# Patient Record
Sex: Female | Born: 1965 | Race: White | Hispanic: No | Marital: Married | Smoking: Never smoker
Health system: Southern US, Community
[De-identification: ages and names within clinical notes are randomized; demographics above are authoritative.]

## PROBLEM LIST (undated history)

## (undated) DIAGNOSIS — M542 Cervicalgia: Secondary | ICD-10-CM

## (undated) DIAGNOSIS — M25512 Pain in left shoulder: Secondary | ICD-10-CM

## (undated) DIAGNOSIS — K649 Unspecified hemorrhoids: Secondary | ICD-10-CM

## (undated) DIAGNOSIS — E119 Type 2 diabetes mellitus without complications: Secondary | ICD-10-CM

## (undated) DIAGNOSIS — F4321 Adjustment disorder with depressed mood: Secondary | ICD-10-CM

## (undated) HISTORY — PX: BREAST BIOPSY: SHX20

## (undated) HISTORY — PX: BREAST SURGERY: SHX581

## (undated) HISTORY — PX: REDUCTION MAMMAPLASTY: SUR839

## (undated) HISTORY — DX: Unspecified hemorrhoids: K64.9

## (undated) HISTORY — PX: ABDOMINOPLASTY: SUR9

---

## 2009-10-02 ENCOUNTER — Emergency Department (HOSPITAL_BASED_OUTPATIENT_CLINIC_OR_DEPARTMENT_OTHER): Admission: EM | Admit: 2009-10-02 | Discharge: 2009-10-02 | Payer: Self-pay | Admitting: Emergency Medicine

## 2009-10-02 ENCOUNTER — Ambulatory Visit: Payer: Self-pay | Admitting: Diagnostic Radiology

## 2010-12-17 ENCOUNTER — Ambulatory Visit
Admission: RE | Admit: 2010-12-17 | Discharge: 2010-12-17 | Payer: Self-pay | Source: Home / Self Care | Attending: Family Medicine | Admitting: Family Medicine

## 2010-12-17 DIAGNOSIS — K219 Gastro-esophageal reflux disease without esophagitis: Secondary | ICD-10-CM | POA: Insufficient documentation

## 2010-12-17 DIAGNOSIS — M62838 Other muscle spasm: Secondary | ICD-10-CM | POA: Insufficient documentation

## 2010-12-24 NOTE — Assessment & Plan Note (Signed)
Summary: new pt to estab, neck pain///sph   Vital Signs:  Patient profile:   45 year old female Height:      62.25 inches Weight:      151 pounds BMI:     27.50 Pulse rate:   66 / minute BP sitting:   104 / 70  (left arm)  Vitals Entered By: Doristine Devoid CMA (December 17, 2010 8:39 AM) CC: NEW EST- neck pain causes HA also radiates to L shoulder    History of Present Illness: 45 yo woman here today to establish care.  previous MD- Riley Nearing.  neck pain- sxs started 2 yrs ago.  initial episode put pt in bed for 1 month.  starts on L side of neck and radiates down into shoulder.  causes HAs.  has difficulty sleeping due to pain.  will have intermittant numbness of upper L arm.  has associated L arm weakness.  has tried ASA, Advil, Aleve w/ temporary results.  has also used muscle cream.  pain is worse w/ activity.    Preventive Screening-Counseling & Management  Alcohol-Tobacco     Alcohol drinks/day: <1     Smoking Status: never  Caffeine-Diet-Exercise     Does Patient Exercise: yes     Type of exercise: yoga, walking      Sexual History:  currently monogamous.        Drug Use:  never.    Current Medications (verified): 1)  None  Allergies (verified): No Known Drug Allergies  Past History:  Past Medical History: hx of Chicken Pox GERD Migraines   Past Surgical History: Caesarean section x2 breast reduction  Family History: CAD-no HTN-mother DM-father STROKE-no COLON CA-no BREAST CA-no   Social History: lives w/ husband and 2 children (son and daughter) stays at home 1 dog, 1 bird originally from [place] Status:  never Does Patient Exercise:  yes Sexual History:  currently monogamous Drug Use:  never  Review of Systems      See HPI  Physical Exam  General:  Well-developed,well-nourished,in no acute distress; alert,appropriate and cooperative throughout examination Head:  Normocephalic and atraumatic without obvious abnormalities. No  apparent alopecia or balding. Neck:  supple and no masses.  + TTP over L Trapezius. Msk:  + TTP over L trapezius w/ painful forward flexion of neck.  no pain w/ neck extension.  full ROM of L shoulder Pulses:  +2 carotid, radial Extremities:  no C/C/E Neurologic:  alert & oriented X3, cranial nerves II-XII intact, strength normal in all extremities, sensation intact to light touch, and DTRs symmetrical and normal.     Impression & Recommendations:  Problem # 1:  MUSCLE SPASM, TRAPEZIUS (ICD-728.85) Assessment New  pt w/ very tight muscle spasm over L trap.  likely cause of pt's neck and upper back pain.  start scheduled NSAIDs, muscle relaxers as needed.  refer to PT for home exercise program.  reviewed supportive care and red flags that should prompt return.  Pt expresses understanding and is in agreement w/ this plan.  Orders: Physical Therapy Referral (PT)  Complete Medication List: 1)  Naprosyn 500 Mg Tabs (Naproxen) .Marland Kitchen.. 1 two times a day x7 days and then as needed.  take w/ food. 2)  Cyclobenzaprine Hcl 10 Mg Tabs (Cyclobenzaprine hcl) .Marland Kitchen.. 1 by mouth 2 times daily as needed for back pain.  will cause drowsiness  Patient Instructions: 1)  Please schedule your complete physical at your convenience- do not eat before this appt 2)  Take the Naprosyn  as directed to relieve the inflammation in your neck 3)  Use the cyclobenzaprine (muscle relaxer) at night and as needed during the day- will make you drowsy 4)  Use a heating pad to relieve the muscle spasm 5)  Once your pain has improved, consider a massage- this will help! 6)  We'll refer you to physical therapy to help improve the pain and relax the muscles 7)  Call with any questions or concerns 8)  Welcome!  We're glad to have you! Prescriptions: CYCLOBENZAPRINE HCL 10 MG  TABS (CYCLOBENZAPRINE HCL) 1 by mouth 2 times daily as needed for back pain.  will cause drowsiness  #30 x 0   Entered and Authorized by:   Neena Rhymes  MD   Signed by:   Neena Rhymes MD on 12/17/2010   Method used:   Print then Give to Patient   RxID:   [id number] NAPROSYN 500 MG TABS (NAPROXEN) 1 two times a day x7 days and then as needed.  take w/ food.  #60 x 1   Entered and Authorized by:   Neena Rhymes MD   Signed by:   Neena Rhymes MD on 12/17/2010   Method used:   Print then Give to Patient   RxID:   [id number]    Orders Added: 1)  Physical Therapy Referral [PT] 2)  New Patient Level II [85462]

## 2011-01-07 ENCOUNTER — Encounter: Payer: Self-pay | Admitting: Family Medicine

## 2011-01-07 ENCOUNTER — Encounter (INDEPENDENT_AMBULATORY_CARE_PROVIDER_SITE_OTHER): Payer: Self-pay | Admitting: *Deleted

## 2011-01-13 NOTE — Letter (Signed)
Summary: Unable To Reach-Consult Scheduled  [facility] [place]  [address] [place], [place] [address]   Phone: [phone]  Fax: [phone]    01/07/2011 MRN: [id number]    Dear Mrs. Buntin,  I have been unable to reach you by phone.  Unfortunately, the phone number(s) we had on file are invalid.  Please contact our office and verify your phone number(s) with [place].    Thank you,    Renee, Patient Care Coordinator [facility]

## 2011-01-19 NOTE — Letter (Signed)
Summary: Unable to reach patient/[facility]  Unable to reach patient/[facility]   Imported By: Maryln Gottron 01/13/2011 12:19:43  _____________________________________________________________________  External Attachment:    Type:   Image     Comment:   External Document

## 2011-02-24 LAB — POCT CARDIAC MARKERS
CKMB, poc: <1 ng/mL — ABNORMAL LOW (ref 1.0–8.0)
Troponin i, poc: <0.05 ng/mL (ref 0.00–0.09)

## 2011-02-24 LAB — BASIC METABOLIC PANEL
BUN: 14 mg/dL (ref 6–23)
Chloride: 108 mEq/L (ref 96–112)
GFR calc Af Amer: >60 mL/min (ref >60)
Potassium: 4 mEq/L (ref 3.5–5.1)
Sodium: 143 mEq/L (ref 135–145)

## 2011-02-24 LAB — CBC
HCT: 41.4 % (ref 36.0–46.0)
Hemoglobin: 14.1 g/dL (ref 12.0–15.0)
MCV: 89.9 fL (ref 78.0–100.0)
RBC: 4.6 MIL/uL (ref 3.87–5.11)
WBC: 5.9 10*3/uL (ref 4.0–10.5)

## 2011-02-24 LAB — DIFFERENTIAL
Eosinophils Absolute: 0.1 10*3/uL (ref 0.0–0.7)
Eosinophils Relative: 2 % (ref 0–5)
Lymphocytes Relative: 37 % (ref 12–46)
Lymphs Abs: 2.2 10*3/uL (ref 0.7–4.0)
Monocytes Absolute: 0.5 10*3/uL (ref 0.1–1.0)
Monocytes Relative: 9 % (ref 3–12)

## 2011-03-18 ENCOUNTER — Other Ambulatory Visit: Payer: Self-pay | Admitting: Obstetrics and Gynecology

## 2011-03-18 DIAGNOSIS — N6459 Other signs and symptoms in breast: Secondary | ICD-10-CM

## 2011-03-24 ENCOUNTER — Ambulatory Visit
Admission: RE | Admit: 2011-03-24 | Discharge: 2011-03-24 | Disposition: A | Discharge status: Home / Self Care | Payer: Managed Care, Other (non HMO) | Source: Ambulatory Visit | Attending: Obstetrics and Gynecology | Admitting: Obstetrics and Gynecology

## 2011-03-24 ENCOUNTER — Other Ambulatory Visit: Payer: Self-pay | Admitting: Obstetrics and Gynecology

## 2011-03-24 DIAGNOSIS — N6459 Other signs and symptoms in breast: Secondary | ICD-10-CM

## 2012-06-21 ENCOUNTER — Ambulatory Visit: Payer: Managed Care, Other (non HMO) | Admitting: Family Medicine

## 2012-07-03 ENCOUNTER — Ambulatory Visit: Payer: Managed Care, Other (non HMO) | Admitting: Family Medicine

## 2017-04-20 ENCOUNTER — Emergency Department (HOSPITAL_BASED_OUTPATIENT_CLINIC_OR_DEPARTMENT_OTHER): Payer: Managed Care, Other (non HMO)

## 2017-04-20 ENCOUNTER — Emergency Department (HOSPITAL_BASED_OUTPATIENT_CLINIC_OR_DEPARTMENT_OTHER)
Admission: EM | Admit: 2017-04-20 | Discharge: 2017-04-21 | Disposition: A | Discharge status: Home / Self Care | Payer: Managed Care, Other (non HMO) | Attending: Emergency Medicine | Admitting: Emergency Medicine

## 2017-04-20 ENCOUNTER — Encounter (HOSPITAL_BASED_OUTPATIENT_CLINIC_OR_DEPARTMENT_OTHER): Payer: Self-pay | Admitting: *Deleted

## 2017-04-20 DIAGNOSIS — Y939 Activity, unspecified: Secondary | ICD-10-CM | POA: Insufficient documentation

## 2017-04-20 DIAGNOSIS — T07XXXA Unspecified multiple injuries, initial encounter: Secondary | ICD-10-CM | POA: Diagnosis present

## 2017-04-20 DIAGNOSIS — S2020XA Contusion of thorax, unspecified, initial encounter: Secondary | ICD-10-CM | POA: Insufficient documentation

## 2017-04-20 DIAGNOSIS — Y929 Unspecified place or not applicable: Secondary | ICD-10-CM | POA: Insufficient documentation

## 2017-04-20 DIAGNOSIS — S301XXA Contusion of abdominal wall, initial encounter: Secondary | ICD-10-CM | POA: Insufficient documentation

## 2017-04-20 DIAGNOSIS — E119 Type 2 diabetes mellitus without complications: Secondary | ICD-10-CM | POA: Insufficient documentation

## 2017-04-20 DIAGNOSIS — N889 Noninflammatory disorder of cervix uteri, unspecified: Secondary | ICD-10-CM | POA: Diagnosis not present

## 2017-04-20 DIAGNOSIS — S161XXA Strain of muscle, fascia and tendon at neck level, initial encounter: Secondary | ICD-10-CM | POA: Insufficient documentation

## 2017-04-20 DIAGNOSIS — S098XXA Other specified injuries of head, initial encounter: Secondary | ICD-10-CM | POA: Insufficient documentation

## 2017-04-20 DIAGNOSIS — N888 Other specified noninflammatory disorders of cervix uteri: Secondary | ICD-10-CM

## 2017-04-20 DIAGNOSIS — S3992XA Unspecified injury of lower back, initial encounter: Secondary | ICD-10-CM | POA: Diagnosis not present

## 2017-04-20 DIAGNOSIS — Y999 Unspecified external cause status: Secondary | ICD-10-CM | POA: Insufficient documentation

## 2017-04-20 DIAGNOSIS — S20219A Contusion of unspecified front wall of thorax, initial encounter: Secondary | ICD-10-CM

## 2017-04-20 HISTORY — DX: Cervicalgia: M54.2

## 2017-04-20 HISTORY — DX: Type 2 diabetes mellitus without complications: E11.9

## 2017-04-20 HISTORY — DX: Adjustment disorder with depressed mood: F43.21

## 2017-04-20 HISTORY — DX: Pain in left shoulder: M25.512

## 2017-04-20 LAB — COMPREHENSIVE METABOLIC PANEL
ALK PHOS: 50 U/L (ref 38–126)
ALT: 18 U/L (ref 14–54)
AST: 18 U/L (ref 15–41)
Albumin: 4.3 g/dL (ref 3.5–5.0)
Anion gap: 8 (ref 5–15)
BUN: 16 mg/dL (ref 6–20)
CALCIUM: 9.6 mg/dL (ref 8.9–10.3)
CHLORIDE: 106 mmol/L (ref 101–111)
CO2: 26 mmol/L (ref 22–32)
CREATININE: 0.72 mg/dL (ref 0.44–1.00)
Glucose, Bld: 140 mg/dL — ABNORMAL HIGH (ref 65–99)
Potassium: 4 mmol/L (ref 3.5–5.1)
Sodium: 140 mmol/L (ref 135–145)
Total Bilirubin: 0.6 mg/dL (ref 0.3–1.2)
Total Protein: 6.8 g/dL (ref 6.5–8.1)

## 2017-04-20 LAB — CBC WITH DIFFERENTIAL/PLATELET
BASOS PCT: 1 %
Basophils Absolute: 0 10*3/uL (ref 0.0–0.1)
EOS ABS: 0.1 10*3/uL (ref 0.0–0.7)
EOS PCT: 2 %
HCT: 40.4 % (ref 36.0–46.0)
Hemoglobin: 13.8 g/dL (ref 12.0–15.0)
LYMPHS ABS: 1.5 10*3/uL (ref 0.7–4.0)
Lymphocytes Relative: 23 %
MCH: 30.1 pg (ref 26.0–34.0)
MCHC: 34.2 g/dL (ref 30.0–36.0)
MCV: 88.2 fL (ref 78.0–100.0)
MONOS PCT: 10 %
Monocytes Absolute: 0.7 10*3/uL (ref 0.1–1.0)
NEUTROS PCT: 64 %
Neutro Abs: 4.3 10*3/uL (ref 1.7–7.7)
PLATELETS: 278 10*3/uL (ref 150–400)
RBC: 4.58 MIL/uL (ref 3.87–5.11)
RDW: 13.3 % (ref 11.5–15.5)
WBC: 6.7 10*3/uL (ref 4.0–10.5)

## 2017-04-20 LAB — URINALYSIS, MICROSCOPIC (REFLEX): RBC / HPF: NONE SEEN RBC/hpf (ref 0–5)

## 2017-04-20 LAB — URINALYSIS, ROUTINE W REFLEX MICROSCOPIC
BILIRUBIN URINE: NEGATIVE
Glucose, UA: NEGATIVE mg/dL
HGB URINE DIPSTICK: NEGATIVE
KETONES UR: NEGATIVE mg/dL
NITRITE: NEGATIVE
PROTEIN: NEGATIVE mg/dL
Specific Gravity, Urine: 1.01 (ref 1.005–1.030)
pH: 6.5 (ref 5.0–8.0)

## 2017-04-20 LAB — PREGNANCY, URINE: PREG TEST UR: NEGATIVE

## 2017-04-20 LAB — I-STAT CG4 LACTIC ACID, ED: LACTIC ACID, VENOUS: 1.13 mmol/L (ref 0.5–1.9)

## 2017-04-20 MED ORDER — ONDANSETRON HCL 4 MG/2ML IJ SOLN
4.0000 mg | Freq: Once | INTRAMUSCULAR | Status: AC
  Administered 2017-04-20: 4 mg via INTRAVENOUS
  Filled 2017-04-20: qty 2

## 2017-04-20 MED ORDER — MORPHINE SULFATE (PF) 4 MG/ML IV SOLN
4.0000 mg | Freq: Once | INTRAVENOUS | Status: AC
  Administered 2017-04-20: 4 mg via INTRAVENOUS
  Filled 2017-04-20: qty 1

## 2017-04-20 NOTE — ED Notes (Signed)
ED Provider at bedside. 

## 2017-04-20 NOTE — ED Notes (Signed)
Patient transported to X-ray 

## 2017-04-20 NOTE — ED Notes (Signed)
amb to BR w/o assist or difficulty
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
## 2017-04-20 NOTE — ED Notes (Signed)
Patient transported to CT 
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
## 2017-04-21 ENCOUNTER — Emergency Department (HOSPITAL_BASED_OUTPATIENT_CLINIC_OR_DEPARTMENT_OTHER): Payer: Managed Care, Other (non HMO)

## 2017-04-21 MED ORDER — OXYCODONE-ACETAMINOPHEN 5-325 MG PO TABS: 1.0000 | ORAL_TABLET | ORAL | 0 refills | Status: DC | PRN

## 2017-04-21 MED ORDER — IOPAMIDOL (ISOVUE-300) INJECTION 61%
100.0000 mL | Freq: Once | INTRAVENOUS | Status: AC | PRN
  Administered 2017-04-21: 100 mL via INTRAVENOUS

## 2017-04-21 NOTE — ED Notes (Signed)
ED Provider at bedside. 

## 2017-04-21 NOTE — ED Notes (Signed)
Radiology states pt refusing iv contrast , d/t being afraid, talked with pt and held hand during ct scan, pt calm and w/o complications , ct completed

## 2017-04-21 NOTE — Discharge Instructions (Signed)
Take acetaminophen, ibuprofen, or naproxen for less severe pain.  Your CT scan showed some cysts on your cervix. Please see your gynecologist to have this evaluated.

## 2018-08-15 ENCOUNTER — Telehealth: Payer: Self-pay

## 2018-08-15 NOTE — Telephone Encounter (Signed)
Please let her know that for the time being I am not accepting new patients, I am sorry!

## 2018-08-15 NOTE — Telephone Encounter (Signed)
Copied from [place] [phone]. Topic: General - Other >> Aug 15, 2018  1:57 PM Sheran Luz wrote: Reason for CRM: Kristy Wilkins called wanting to establish care, was referred by her friend [name] to try to  Dr. Lorelei Pont. Advised her that Dr. Lorelei Pont was not accepting new patients at this time. [name] is requesting call back to discuss if/when Dr Lorelei Pont will be accepting new patients.

## 2018-08-16 NOTE — Telephone Encounter (Signed)
Left pt a voicemail stating we are sorry to inform her that Dr. Lorelei Pont is not taking new patients at this time. We do have two other providers in the office that are accepting new patients but they are males and if she wanted to call us back to establish care with one of them to let us know.

## 2018-08-16 NOTE — Telephone Encounter (Signed)
Please inform Pt of Dr. Serita Grit decision. Thank you.

## 2018-09-20 ENCOUNTER — Ambulatory Visit: Payer: Managed Care, Other (non HMO) | Admitting: Medical

## 2018-09-21 ENCOUNTER — Ambulatory Visit: Payer: Managed Care, Other (non HMO) | Admitting: Medical

## 2018-09-29 ENCOUNTER — Encounter: Payer: Managed Care, Other (non HMO) | Admitting: Obstetrics and Gynecology

## 2018-10-02 ENCOUNTER — Ambulatory Visit: Payer: Managed Care, Other (non HMO) | Admitting: Family Medicine

## 2018-10-02 ENCOUNTER — Encounter: Payer: Self-pay | Admitting: Family Medicine

## 2018-10-02 VITALS — BP 110/82 | HR 85 | Temp 98.7°F | Ht 62.5 in | Wt 149.5 lb

## 2018-10-02 DIAGNOSIS — Z1211 Encounter for screening for malignant neoplasm of colon: Secondary | ICD-10-CM

## 2018-10-02 DIAGNOSIS — F411 Generalized anxiety disorder: Secondary | ICD-10-CM

## 2018-10-02 DIAGNOSIS — Z0000 Encounter for general adult medical examination without abnormal findings: Secondary | ICD-10-CM | POA: Diagnosis not present

## 2018-10-02 MED ORDER — SERTRALINE HCL 50 MG PO TABS: 50.0000 mg | ORAL_TABLET | Freq: Every day | ORAL | 3 refills | Status: DC

## 2018-10-02 NOTE — Patient Instructions (Addendum)
Give us 2-3 business days to get the results of your labs back.   Keep the diet clean and stay active.  If you do not hear anything about your referral in the next 1-2 weeks, call our office and ask for an update.  Coping skills Choose 5 that work for you:  Take a deep breath  Count to 20  Read a book  Do a puzzle  Meditate  Bake  Sing  Knit  Garden  Pray  Go outside  Call a friend  Listen to music  Take a walk  Color  Send a note  Take a bath  Watch a movie  Be alone in a quiet place  Pet an animal  Visit a friend  Journal  Exercise  Stretch   Let us know if you need anything. 

## 2018-10-02 NOTE — Progress Notes (Signed)
Chief Complaint  Patient presents with  . New Patient (Initial Visit)     Well Woman Kristy Wilkins is here for a complete physical.   Her last physical was >1 year ago.  Current diet: in general, a "very good" diet. Current exercise: gym 2x/week- cardio, some weights. Weight is stable and she denies daytime fatigue. No LMP recorded..  Seatbelt? Yes   Health Maintenance Pap/HPV- Yes - 1 yr ago Mammogram- Yes Tetanus- Yes 12/23/2012 Hep C screening- Yes 12/23/2012 HIV screening- Yes 12/23/2012  Past Medical History:  Diagnosis Date  . Adjustment disorder with depressed mood   . Diabetes mellitus without complication ([place])   . Left shoulder pain   . Neck pain      Past Surgical History:  Procedure Laterality Date  . BREAST SURGERY    . CESAREAN SECTION      Medications  Current Outpatient Medications on File Prior to Visit  Medication Sig Dispense Refill  . citalopram (CELEXA) 20 MG tablet Take 1 and 1/2 tablets by mouth daily    . meloxicam (MOBIC) 15 MG tablet Take 15 mg by mouth daily as needed for pain.     Allergies No Known Allergies  Review of Systems: Constitutional:  no unexpected weight changes Eye:  no recent significant change in vision Ear/Nose/Mouth/Throat:  Ears:  no tinnitus or vertigo and no recent change in hearing Nose/Mouth/Throat:  no complaints of nasal congestion, no sore throat Cardiovascular: no chest pain Respiratory:  no cough and no shortness of breath Gastrointestinal:  no abdominal pain, no change in bowel habits GU:  Female: negative for dysuria or pelvic pain Musculoskeletal/Extremities:  no pain of the joints Integumentary (Skin/Breast):  +lesion on bottom that is growing and lesion on R forearm Neurologic:  no headaches Endocrine:  denies fatigue Psych: +anxiety/stress  Exam BP 110/82 (BP Location: Right Arm, Patient Position: Sitting, Cuff Size: Normal)   Pulse 85   Temp 98.7 F (37.1 C) (Oral)   Ht 5' 2.5" (1.588 m)   Wt 149 lb  8 oz (67.8 kg)   SpO2 98%   BMI 26.91 kg/m  General:  well developed, well nourished, in no apparent distress Skin: Patient examined with a female chaperone; there is a semisolid and circular lesion on the R buttock that is mildly fluctuance, no drainage, erythema or ttp; there is an indurated and circular lesion on volar surface of R forearm with hyperpigmentation, no ttp/erythema/fluctuance; otherwise no significant moles, warts, or growths Head:  no masses, lesions, or tenderness Eyes:  pupils equal and round, sclera anicteric without injection Ears:  canals without lesions, TMs shiny without retraction, no obvious effusion, no erythema Nose:  nares patent, septum midline, mucosa normal, and no drainage or sinus tenderness Throat/Pharynx:  lips and gingiva without lesion; tongue and uvula midline; non-inflamed pharynx; no exudates or postnasal drainage Neck: neck supple without adenopathy, thyromegaly, or masses Lungs:  clear to auscultation, breath sounds equal bilaterally, no respiratory distress Cardio:  regular rate and rhythm, no bruits, no LE edema Abdomen:  abdomen soft, nontender; bowel sounds normal; no masses or organomegaly Genital: Defer to GYN Musculoskeletal:  symmetrical muscle groups noted without atrophy or deformity Extremities:  no clubbing, cyanosis, or edema, no deformities, no skin discoloration Neuro:  gait normal; deep tendon reflexes normal and symmetric Psych: well oriented with normal range of affect and appropriate judgment/insight  Assessment and Plan  Well adult exam - Plan: Hemoglobin A1c, Lipid panel, Comprehensive metabolic panel, CBC  Screen for colon  cancer - Plan: Ambulatory referral to Gastroenterology  GAD (generalized anxiety disorder)   Well 52 y.o. female. Counseled on diet and exercise. Other orders as above. Offered to remove, will monitor cystic area on right glute. Follow up in 6 weeks to see how she is doing with anxiety. The patient  voiced understanding and agreement to the plan.  [place], DO 10/02/18 4:47 PM

## 2018-10-02 NOTE — Progress Notes (Signed)
Pre visit review using our clinic review tool, if applicable. No additional management support is needed unless otherwise documented below in the visit note. 

## 2018-10-03 LAB — CBC
HCT: 42.4 % (ref 36.0–46.0)
HEMOGLOBIN: 14.1 g/dL (ref 12.0–15.0)
MCHC: 33.3 g/dL (ref 30.0–36.0)
MCV: 88.3 fl (ref 78.0–100.0)
PLATELETS: 318 10*3/uL (ref 150.0–400.0)
RBC: 4.81 Mil/uL (ref 3.87–5.11)
RDW: 14.1 % (ref 11.5–15.5)
WBC: 6.3 10*3/uL (ref 4.0–10.5)

## 2018-10-03 LAB — COMPREHENSIVE METABOLIC PANEL
ALT: 12 U/L (ref 0–35)
AST: 12 U/L (ref 0–37)
Albumin: 4.7 g/dL (ref 3.5–5.2)
Alkaline Phosphatase: 46 U/L (ref 39–117)
BILIRUBIN TOTAL: 0.8 mg/dL (ref 0.2–1.2)
BUN: 13 mg/dL (ref 6–23)
CALCIUM: 9.8 mg/dL (ref 8.4–10.5)
CO2: 25 meq/L (ref 19–32)
Chloride: 106 mEq/L (ref 96–112)
Creatinine, Ser: 0.71 mg/dL (ref 0.40–1.20)
GFR: 91.95 mL/min (ref >60.00)
Glucose, Bld: 89 mg/dL (ref 70–99)
POTASSIUM: 4.1 meq/L (ref 3.5–5.1)
Sodium: 138 mEq/L (ref 135–145)
Total Protein: 6.9 g/dL (ref 6.0–8.3)

## 2018-10-03 LAB — LIPID PANEL
Cholesterol: 168 mg/dL (ref 0–200)
HDL: 72.1 mg/dL (ref >39.00)
LDL Cholesterol: 86 mg/dL (ref 0–99)
NONHDL: 96
Total CHOL/HDL Ratio: 2
Triglycerides: 51 mg/dL (ref 0.0–149.0)
VLDL: 10.2 mg/dL (ref 0.0–40.0)

## 2018-10-03 LAB — HEMOGLOBIN A1C: Hgb A1c MFr Bld: 5.6 % (ref 4.6–6.5)

## 2018-10-12 ENCOUNTER — Encounter: Payer: Managed Care, Other (non HMO) | Admitting: Obstetrics and Gynecology

## 2018-10-26 ENCOUNTER — Encounter: Payer: Managed Care, Other (non HMO) | Admitting: Obstetrics and Gynecology

## 2018-10-30 ENCOUNTER — Encounter: Payer: Self-pay | Admitting: Gastroenterology

## 2018-11-06 ENCOUNTER — Other Ambulatory Visit: Payer: Self-pay

## 2018-11-06 ENCOUNTER — Encounter: Payer: Self-pay | Admitting: Obstetrics and Gynecology

## 2018-11-06 ENCOUNTER — Ambulatory Visit: Payer: Managed Care, Other (non HMO) | Admitting: Obstetrics and Gynecology

## 2018-11-06 ENCOUNTER — Other Ambulatory Visit (HOSPITAL_COMMUNITY)
Admission: RE | Admit: 2018-11-06 | Discharge: 2018-11-06 | Disposition: A | Discharge status: Home / Self Care | Payer: Managed Care, Other (non HMO) | Source: Ambulatory Visit | Attending: Obstetrics and Gynecology | Admitting: Obstetrics and Gynecology

## 2018-11-06 VITALS — BP 120/82 | HR 70 | Resp 14 | Ht 62.5 in | Wt 150.8 lb

## 2018-11-06 DIAGNOSIS — T8332XA Displacement of intrauterine contraceptive device, initial encounter: Secondary | ICD-10-CM

## 2018-11-06 DIAGNOSIS — N939 Abnormal uterine and vaginal bleeding, unspecified: Secondary | ICD-10-CM | POA: Diagnosis not present

## 2018-11-06 DIAGNOSIS — Z01419 Encounter for gynecological examination (general) (routine) without abnormal findings: Secondary | ICD-10-CM

## 2018-11-06 DIAGNOSIS — N6320 Unspecified lump in the left breast, unspecified quadrant: Secondary | ICD-10-CM

## 2018-11-06 DIAGNOSIS — N888 Other specified noninflammatory disorders of cervix uteri: Secondary | ICD-10-CM

## 2018-11-06 DIAGNOSIS — N6310 Unspecified lump in the right breast, unspecified quadrant: Secondary | ICD-10-CM
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
## 2018-11-06 NOTE — Progress Notes (Signed)
Patient scheduled while in office for bilateral Dx MMG and left and right [place], if needed. Last screening MMG at [facility] 04/27/2017, patient request to schedule at [facility]. Scheduled for 11/17/18 arriving at 9:40am, appt at 10am. MMG release form for [facility] completed and faxed to [facility] while in office. Patient aware if images not received at [facility] prior to appointment, appt may need to be rescheduled. Patient verbalizes understanding and is agreeable.

## 2018-11-07 ENCOUNTER — Telehealth: Payer: Self-pay | Admitting: Obstetrics and Gynecology

## 2018-11-07 NOTE — Telephone Encounter (Signed)
Call placed to patient to review benefits for an ultrasound and endometrial biopsy.  Left voicemail message requesting a return call

## 2018-11-08 LAB — CYTOLOGY - PAP
Diagnosis: NEGATIVE
HPV: NOT DETECTED

## 2018-11-17 ENCOUNTER — Ambulatory Visit
Admission: RE | Admit: 2018-11-17 | Discharge: 2018-11-17 | Disposition: A | Discharge status: Home / Self Care | Payer: Managed Care, Other (non HMO) | Source: Ambulatory Visit | Attending: Obstetrics and Gynecology | Admitting: Obstetrics and Gynecology

## 2018-11-17 DIAGNOSIS — N6320 Unspecified lump in the left breast, unspecified quadrant: Principal | ICD-10-CM

## 2018-11-17 DIAGNOSIS — N6310 Unspecified lump in the right breast, unspecified quadrant: Secondary | ICD-10-CM

## 2018-11-23 ENCOUNTER — Encounter: Payer: Self-pay | Admitting: Obstetrics and Gynecology

## 2018-11-23 ENCOUNTER — Ambulatory Visit (INDEPENDENT_AMBULATORY_CARE_PROVIDER_SITE_OTHER): Payer: Managed Care, Other (non HMO)

## 2018-11-23 ENCOUNTER — Other Ambulatory Visit: Payer: Self-pay

## 2018-11-23 ENCOUNTER — Ambulatory Visit (INDEPENDENT_AMBULATORY_CARE_PROVIDER_SITE_OTHER): Payer: Managed Care, Other (non HMO) | Admitting: Obstetrics and Gynecology

## 2018-11-23 VITALS — BP 118/84 | HR 72 | Ht 62.5 in | Wt 150.0 lb

## 2018-11-23 DIAGNOSIS — N888 Other specified noninflammatory disorders of cervix uteri: Secondary | ICD-10-CM

## 2018-11-23 DIAGNOSIS — K649 Unspecified hemorrhoids: Secondary | ICD-10-CM | POA: Diagnosis not present

## 2018-11-23 DIAGNOSIS — N939 Abnormal uterine and vaginal bleeding, unspecified: Secondary | ICD-10-CM

## 2018-11-23 DIAGNOSIS — Z30431 Encounter for routine checking of intrauterine contraceptive device: Secondary | ICD-10-CM | POA: Diagnosis not present
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
## 2018-11-28 ENCOUNTER — Encounter: Payer: Self-pay | Admitting: Gastroenterology

## 2018-11-28 ENCOUNTER — Ambulatory Visit (AMBULATORY_SURGERY_CENTER): Payer: Self-pay | Admitting: *Deleted

## 2018-11-28 VITALS — Ht 62.0 in | Wt 153.0 lb

## 2018-11-28 DIAGNOSIS — Z1211 Encounter for screening for malignant neoplasm of colon: Secondary | ICD-10-CM

## 2018-11-28 MED ORDER — NA SULFATE-K SULFATE-MG SULF 17.5-3.13-1.6 GM/177ML PO SOLN: ORAL | 0 refills | Status: DC

## 2018-11-28 NOTE — Progress Notes (Signed)
Patient denies any allergies to eggs or soy. Patient denies any problems with anesthesia/sedation. Patient denies any oxygen use at home. Patient denies taking any diet/weight loss medications or blood thinners. EMMI education offered, pt declined. Daughter is with patient during PV today.

## 2018-12-11 ENCOUNTER — Encounter: Payer: Self-pay | Admitting: Gastroenterology

## 2018-12-11 ENCOUNTER — Ambulatory Visit (AMBULATORY_SURGERY_CENTER): Payer: Managed Care, Other (non HMO) | Admitting: Gastroenterology

## 2018-12-11 VITALS — BP 127/88 | HR 75 | Temp 98.9°F | Resp 14 | Ht 62.0 in | Wt 153.0 lb

## 2018-12-11 DIAGNOSIS — Z1211 Encounter for screening for malignant neoplasm of colon: Secondary | ICD-10-CM

## 2018-12-11 DIAGNOSIS — D129 Benign neoplasm of anus and anal canal: Secondary | ICD-10-CM

## 2018-12-11 DIAGNOSIS — D120 Benign neoplasm of cecum: Secondary | ICD-10-CM

## 2018-12-11 DIAGNOSIS — D128 Benign neoplasm of rectum: Secondary | ICD-10-CM

## 2018-12-11 MED ORDER — SODIUM CHLORIDE 0.9 % IV SOLN: 500.0000 mL | Freq: Once | INTRAVENOUS | Status: DC

## 2018-12-11 NOTE — Patient Instructions (Signed)
YOU HAD AN ENDOSCOPIC PROCEDURE TODAY AT THE [facility]:   Refer to the procedure report that was given to you for any specific questions about what was found during the examination.  If the procedure report does not answer your questions, please call your gastroenterologist to clarify.  If you requested that your care partner not be given the details of your procedure findings, then the procedure report has been included in a sealed envelope for you to review at your convenience later.  YOU SHOULD EXPECT: Some feelings of bloating in the abdomen. Passage of more gas than usual.  Walking can help get rid of the air that was put into your GI tract during the procedure and reduce the bloating. If you had a lower endoscopy (such as a colonoscopy or flexible sigmoidoscopy) you may notice spotting of blood in your stool or on the toilet paper. If you underwent a bowel prep for your procedure, you may not have a normal bowel movement for a few days.  Please Note:  You might notice some irritation and congestion in your nose or some drainage.  This is from the oxygen used during your procedure.  There is no need for concern and it should clear up in a day or so.  SYMPTOMS TO REPORT IMMEDIATELY:   Following lower endoscopy (colonoscopy or flexible sigmoidoscopy):  Excessive amounts of blood in the stool  Significant tenderness or worsening of abdominal pains  Swelling of the abdomen that is new, acute  Fever of 100F or higher  For urgent or emergent issues, a gastroenterologist can be reached at any hour by calling [phone].   DIET:  We do recommend a small meal at first, but then you may proceed to your regular diet.  Drink plenty of fluids but you should avoid alcoholic beverages for 24 hours.  ACTIVITY:  You should plan to take it easy for the rest of today and you should NOT DRIVE or use heavy machinery until tomorrow (because of the sedation medicines used during the test).     FOLLOW UP: Our staff will call the number listed on your records the next business day following your procedure to check on you and address any questions or concerns that you may have regarding the information given to you following your procedure. If we do not reach you, we will leave a message.  However, if you are feeling well and you are not experiencing any problems, there is no need to return our call.  We will assume that you have returned to your regular daily activities without incident.  If any biopsies were taken you will be contacted by phone or by letter within the next 1-3 weeks.  Please call us at [phone] if you have not heard about the biopsies in 3 weeks.   Await for biopsy results Polyps (handout given)    SIGNATURES/CONFIDENTIALITY: You and/or your care partner have signed paperwork which will be entered into your electronic medical record.  These signatures attest to the fact that that the information above on your After Visit Summary has been reviewed and is understood.  Full responsibility of the confidentiality of this discharge information lies with you and/or your care-partner. 

## 2018-12-11 NOTE — Progress Notes (Signed)
PT taken to PACU. Monitors in place. VSS. Report given to RN. 

## 2018-12-11 NOTE — Op Note (Signed)
[place] Patient Name: Kristy Wilkins Procedure Date: 12/11/2018 10:53 AM MRN: [id number] Endoscopist: Thornton Park MD, MD Age: 53 Referring MD:  Date of Birth: 11/06/1966 Gender: Female Account #: [id number] Procedure:                Colonoscopy Indications:              Screening for colorectal malignant neoplasm, This                            is the patient's first colonoscopy. No known family                            history of colon cancer or polyps. No baseline GI                            symptoms. Medicines:                See the Anesthesia note for documentation of the                            administered medications Procedure:                Pre-Anesthesia Assessment:                           - Prior to the procedure, a History and Physical                            was performed, and patient medications and                            allergies were reviewed. The patient's tolerance of                            previous anesthesia was also reviewed. The risks                            and benefits of the procedure and the sedation                            options and risks were discussed with the patient.                            All questions were answered, and informed consent                            was obtained. Prior Anticoagulants: The patient has                            taken no previous anticoagulant or antiplatelet                            agents. ASA Grade Assessment: II - A patient with  mild systemic disease. After reviewing the risks                            and benefits, the patient was deemed in                            satisfactory condition to undergo the procedure.                           After obtaining informed consent, the colonoscope                            was passed under direct vision. Throughout the                            procedure, the patient's blood pressure, pulse, and                             oxygen saturations were monitored continuously. The                            Colonoscope was introduced through the anus and                            advanced to the the terminal ileum, with                            identification of the appendiceal orifice and IC                            valve. The colonoscopy was performed without                            difficulty. The patient tolerated the procedure                            well. The quality of the bowel preparation was                            excellent. Scope In: 11:11:58 AM Scope Out: 11:24:06 AM Scope Withdrawal Time: 0 hours 9 minutes 46 seconds  Total Procedure Duration: 0 hours 12 minutes 8 seconds  Findings:                 Hemorrhoids were found on perianal exam.                           Non-bleeding external and internal hemorrhoids were                            found. The hemorrhoids were small.                           A 4 mm polyp was found in the cecum. The polyp was  sessile. The polyp was removed with a cold snare.                            Resection and retrieval were complete. Estimated                            blood loss was minimal.                           A 14 mm polyp was found in the rectum. It was                            located 15 cm from the anal verge. The polyp was                            sessile. The mucosa surrounding the base appeared                            verrucous, but the polyp margins were clearly                            delineated. The polyp was removed with a hot snare.                            Resection and retrieval were complete.                           The exam was otherwise without abnormality on                            direct and retroflexion views. Complications:            No immediate complications. Estimated blood loss:                            Minimal. Estimated Blood Loss:     Estimated  blood loss was minimal. Impression:               - Hemorrhoids found on perianal exam.                           - Non-bleeding external and internal hemorrhoids.                           - One 4 mm polyp in the cecum, removed with a cold                            snare. Resected and retrieved.                           - One 14 mm polyp in the rectum, removed with a hot                            snare. Resected and retrieved.                           -  The examination was otherwise normal on direct                            and retroflexion views. Recommendation:           - Discharge patient to home.                           - Resume regular diet.                           - Continue present medications.                           - Await pathology results.                           - Repeat colonoscopy date to be determined after                            pending pathology results are reviewed for                            surveillance based on pathology results. Thornton Park MD, MD 12/11/2018 11:37:50 AM This report has been signed electronically.

## 2018-12-11 NOTE — Progress Notes (Signed)
1152 Asked by Recovery Nurse to eval pt's mental status, 30 min after last propofol dose remains somewhat somulent but purposely withdraws fron alcohol wipe under nose, has responded to daughter  34 now conversant with her daughter, A/O x 3

## 2018-12-11 NOTE — Progress Notes (Signed)
Pt is drowsy in recovery , still sleeping on and off. Vital signs wnl, skin warm and dry. I asked J. Nulty CRNA and Dr. Tarri Glenn to come assess pt due to her sleeping more than usual.  Dr. Tarri Glenn in to assess pt has awaken alert and talking no further concerns.

## 2018-12-11 NOTE — Progress Notes (Signed)
Called to room to assist during endoscopic procedure.  Patient ID and intended procedure confirmed with present staff. Received instructions for my participation in the procedure from the performing physician.  

## 2018-12-12 ENCOUNTER — Telehealth: Payer: Self-pay | Admitting: *Deleted

## 2018-12-12 NOTE — Telephone Encounter (Signed)
  Follow up Call-  Call back number 12/11/2018  Post procedure Call Back phone  # [phone]  Permission to leave phone message Yes  Some recent data might be hidden     Patient questions:  Do you have a fever, pain , or abdominal swelling? No. Pain Score  0 *  Have you tolerated food without any problems? Yes.    Have you been able to return to your normal activities? Yes.    Do you have any questions about your discharge instructions: Diet   No. Medications  No. Follow up visit  No.  Do you have questions or concerns about your Care? No.  Actions: * If pain score is 4 or above: No action needed, pain <4.

## 2018-12-18 ENCOUNTER — Encounter: Payer: Self-pay | Admitting: Gastroenterology

## 2019-02-05 ENCOUNTER — Other Ambulatory Visit: Payer: Self-pay

## 2019-02-05 ENCOUNTER — Encounter: Payer: Self-pay | Admitting: Family Medicine

## 2019-02-05 ENCOUNTER — Ambulatory Visit: Payer: Managed Care, Other (non HMO) | Admitting: Family Medicine

## 2019-02-05 VITALS — BP 100/70 | HR 88 | Temp 98.1°F | Ht 62.5 in | Wt 143.4 lb

## 2019-02-05 DIAGNOSIS — R634 Abnormal weight loss: Secondary | ICD-10-CM | POA: Diagnosis not present

## 2019-02-05 DIAGNOSIS — R1013 Epigastric pain: Secondary | ICD-10-CM | POA: Diagnosis not present

## 2019-02-05 MED ORDER — PANTOPRAZOLE SODIUM 40 MG PO TBEC: 40.0000 mg | DELAYED_RELEASE_TABLET | Freq: Every day | ORAL | 3 refills | Status: DC

## 2019-02-05 NOTE — Patient Instructions (Addendum)
If you do not hear anything about your referral in the next 1-2 weeks, call our office and ask for an update.  Try to monitor which foods set you off.  Let us know if you need anything.
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
## 2019-03-01 ENCOUNTER — Other Ambulatory Visit: Payer: Self-pay

## 2019-03-01 ENCOUNTER — Ambulatory Visit (INDEPENDENT_AMBULATORY_CARE_PROVIDER_SITE_OTHER): Payer: Managed Care, Other (non HMO) | Admitting: Gastroenterology

## 2019-03-01 ENCOUNTER — Encounter: Payer: Self-pay | Admitting: Gastroenterology

## 2019-03-01 DIAGNOSIS — R1011 Right upper quadrant pain: Secondary | ICD-10-CM

## 2019-03-01 DIAGNOSIS — R634 Abnormal weight loss: Secondary | ICD-10-CM

## 2019-03-01 DIAGNOSIS — R109 Unspecified abdominal pain: Secondary | ICD-10-CM

## 2019-03-01 NOTE — Progress Notes (Signed)
   TELEHEALTH VISIT  Referring Provider: Shelda Pal* Primary Care Physician:  Shelda Pal, DO  Scheduled for 10:00am 03/01/19 Unable to reach the patient by phone despite multiple attempts. I called the only phone number on file: [phone] "Hi. This is Carmie End. I cannot come to the phone right now..."

## 2019-03-01 NOTE — Patient Instructions (Signed)
Continue Pantoprazole 40 mg daily.   A CT of your abdomen and pelvis is recommended this will be scheduled at [facility]. They will call you with an appointment.
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
## 2019-03-26 ENCOUNTER — Encounter: Payer: Self-pay | Admitting: *Deleted

## 2019-03-26 ENCOUNTER — Telehealth: Payer: Self-pay | Admitting: Gastroenterology

## 2019-03-26 NOTE — Telephone Encounter (Signed)
Letter sent to MyChart and printed and placed with contrast at the front desk.

## 2019-03-26 NOTE — Telephone Encounter (Signed)
Spoke to patient who reports intermittent nausea, vomiting and diarrhea. Vomited 3 times in the last 24 hours with small clots of "blood." Vomiting "foamy liquid," light brown, small to medium volumes. Afebrile, weakness, mild dizziness, loss of appetite, 12 lb weight loss over the last 3 months. Patient reports she is able drink without vomiting, and at times able to eat without vomiting. Reports nausea 3-4 times per week. Patient reports increased levels of stress and contributes increased abd pain with these increased levels of stress. Patient reports liquid to soft stools Friday, stools now normal. No blood in stool, no new antibiotics, no sick contacts, no travel.

## 2019-03-26 NOTE — Telephone Encounter (Signed)
I am sorry that she is not feeling well. Please ask her to increase her pantoprazole to 40 mg twice daily. Please try to move up the CT scan. Thanks.

## 2019-03-26 NOTE — Telephone Encounter (Signed)
Patient called said she is having very small blood clot when vomiting and is not eating much. She has a CT scan in June. Would like to know if we can move it closer

## 2019-03-26 NOTE — Telephone Encounter (Signed)
Spoke to patient about Dr. Tarri Glenn recommendations (increasing Pantoprazole 40 mg BID). CT rescheduled sooner on 04/03/19 at 10:30 am. Instructions for contrast discussed with the patient and left at the front desk. Patient verbalized understanding. No additional questions.

## 2019-04-03 ENCOUNTER — Ambulatory Visit
Admission: RE | Admit: 2019-04-03 | Discharge: 2019-04-03 | Disposition: A | Discharge status: Home / Self Care | Payer: Managed Care, Other (non HMO) | Source: Ambulatory Visit | Attending: Gastroenterology | Admitting: Gastroenterology

## 2019-04-03 ENCOUNTER — Telehealth: Payer: Self-pay | Admitting: *Deleted

## 2019-04-03 ENCOUNTER — Other Ambulatory Visit: Payer: Self-pay | Admitting: *Deleted

## 2019-04-03 ENCOUNTER — Other Ambulatory Visit: Payer: Self-pay

## 2019-04-03 DIAGNOSIS — R634 Abnormal weight loss: Secondary | ICD-10-CM

## 2019-04-03 DIAGNOSIS — R1011 Right upper quadrant pain: Secondary | ICD-10-CM

## 2019-04-03 MED ORDER — IOPAMIDOL (ISOVUE-300) INJECTION 61%: 100.0000 mL | Freq: Once | INTRAVENOUS | Status: DC | PRN

## 2019-04-03 MED ORDER — IOPAMIDOL (ISOVUE-300) INJECTION 61%
100.0000 mL | Freq: Once | INTRAVENOUS | Status: AC | PRN
  Administered 2019-04-03: 100 mL via INTRAVENOUS

## 2019-04-03 NOTE — Telephone Encounter (Signed)
Spoke to patient, notified her of CT results and HIDA scan appt and instructions.  HIDA scan order in Epic.  Scheduled at [facility] on 04/17/19 at 7:00 am.  NPO after midnight. Arrive 15 minutes early.

## 2019-04-04 ENCOUNTER — Ambulatory Visit (INDEPENDENT_AMBULATORY_CARE_PROVIDER_SITE_OTHER): Payer: Managed Care, Other (non HMO) | Admitting: Family Medicine

## 2019-04-04 ENCOUNTER — Encounter: Payer: Self-pay | Admitting: Family Medicine

## 2019-04-04 DIAGNOSIS — R1013 Epigastric pain: Secondary | ICD-10-CM

## 2019-04-04 DIAGNOSIS — K920 Hematemesis: Secondary | ICD-10-CM

## 2019-04-04 DIAGNOSIS — R634 Abnormal weight loss: Secondary | ICD-10-CM | POA: Diagnosis not present
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
## 2019-04-06 ENCOUNTER — Other Ambulatory Visit (INDEPENDENT_AMBULATORY_CARE_PROVIDER_SITE_OTHER): Payer: Managed Care, Other (non HMO)

## 2019-04-06 ENCOUNTER — Other Ambulatory Visit: Payer: Self-pay

## 2019-04-06 DIAGNOSIS — K920 Hematemesis: Secondary | ICD-10-CM

## 2019-04-06 DIAGNOSIS — R1011 Right upper quadrant pain: Secondary | ICD-10-CM

## 2019-04-06 DIAGNOSIS — R634 Abnormal weight loss: Secondary | ICD-10-CM

## 2019-04-06 LAB — CBC WITH DIFFERENTIAL/PLATELET
Absolute Monocytes: 519 cells/uL (ref 200–950)
Basophils Absolute: 77 cells/uL (ref 0–200)
Basophils Relative: 1.3 %
Eosinophils Absolute: 71 cells/uL (ref 15–500)
Eosinophils Relative: 1.2 %
HCT: 43.2 % (ref 35.0–45.0)
Hemoglobin: 14.6 g/dL (ref 11.7–15.5)
Lymphs Abs: 1652 cells/uL (ref 850–3900)
MCH: 30.2 pg (ref 27.0–33.0)
MCHC: 33.8 g/dL (ref 32.0–36.0)
MCV: 89.4 fL (ref 80.0–100.0)
MPV: 9.5 fL (ref 7.5–12.5)
Monocytes Relative: 8.8 %
Neutro Abs: 3581 cells/uL (ref 1500–7800)
Neutrophils Relative %: 60.7 %
Platelets: 397 10*3/uL (ref 140–400)
RBC: 4.83 10*6/uL (ref 3.80–5.10)
RDW: 13 % (ref 11.0–15.0)
Total Lymphocyte: 28 %
WBC: 5.9 10*3/uL (ref 3.8–10.8)

## 2019-04-06 NOTE — Addendum Note (Signed)
Addended by: Caffie Pinto on: 04/06/2019 03:30 PM   Modules accepted: Orders

## 2019-04-06 NOTE — Addendum Note (Signed)
Addended by: Caffie Pinto on: 04/06/2019 08:36 AM   Modules accepted: Orders

## 2019-04-17 ENCOUNTER — Ambulatory Visit (HOSPITAL_COMMUNITY): Payer: Managed Care, Other (non HMO)

## 2019-04-25 ENCOUNTER — Emergency Department (HOSPITAL_BASED_OUTPATIENT_CLINIC_OR_DEPARTMENT_OTHER): Payer: Managed Care, Other (non HMO)

## 2019-04-25 ENCOUNTER — Encounter (HOSPITAL_BASED_OUTPATIENT_CLINIC_OR_DEPARTMENT_OTHER): Payer: Self-pay

## 2019-04-25 ENCOUNTER — Other Ambulatory Visit: Payer: Self-pay

## 2019-04-25 ENCOUNTER — Emergency Department (HOSPITAL_BASED_OUTPATIENT_CLINIC_OR_DEPARTMENT_OTHER)
Admission: EM | Admit: 2019-04-25 | Discharge: 2019-04-25 | Disposition: A | Discharge status: Home / Self Care | Payer: Managed Care, Other (non HMO) | Attending: Emergency Medicine | Admitting: Emergency Medicine

## 2019-04-25 ENCOUNTER — Other Ambulatory Visit: Payer: Managed Care, Other (non HMO)

## 2019-04-25 DIAGNOSIS — R0789 Other chest pain: Secondary | ICD-10-CM | POA: Diagnosis not present

## 2019-04-25 DIAGNOSIS — R079 Chest pain, unspecified: Secondary | ICD-10-CM | POA: Diagnosis present

## 2019-04-25 DIAGNOSIS — E119 Type 2 diabetes mellitus without complications: Secondary | ICD-10-CM | POA: Insufficient documentation

## 2019-04-25 DIAGNOSIS — Z79899 Other long term (current) drug therapy: Secondary | ICD-10-CM | POA: Insufficient documentation

## 2019-04-25 LAB — BASIC METABOLIC PANEL
Anion gap: 7 (ref 5–15)
BUN: 11 mg/dL (ref 6–20)
CO2: 23 mmol/L (ref 22–32)
Calcium: 9.2 mg/dL (ref 8.9–10.3)
Chloride: 107 mmol/L (ref 98–111)
Creatinine, Ser: 0.61 mg/dL (ref 0.44–1.00)
GFR calc Af Amer: >60 mL/min (ref >60)
GFR calc non Af Amer: >60 mL/min (ref >60)
Glucose, Bld: 118 mg/dL — ABNORMAL HIGH (ref 70–99)
Potassium: 3.4 mmol/L — ABNORMAL LOW (ref 3.5–5.1)
Sodium: 137 mmol/L (ref 135–145)

## 2019-04-25 LAB — CBC
HCT: 43.2 % (ref 36.0–46.0)
Hemoglobin: 14 g/dL (ref 12.0–15.0)
MCH: 29.4 pg (ref 26.0–34.0)
MCHC: 32.4 g/dL (ref 30.0–36.0)
MCV: 90.6 fL (ref 80.0–100.0)
Platelets: 275 10*3/uL (ref 150–400)
RBC: 4.77 MIL/uL (ref 3.87–5.11)
RDW: 13.1 % (ref 11.5–15.5)
WBC: 7 10*3/uL (ref 4.0–10.5)
nRBC: 0 % (ref 0.0–0.2)

## 2019-04-25 LAB — TROPONIN I
Troponin I: <0.03 ng/mL (ref <0.03)
Troponin I: <0.03 ng/mL (ref <0.03)

## 2019-04-25 LAB — D-DIMER, QUANTITATIVE: D-Dimer, Quant: 0.31 ug/mL-FEU (ref 0.00–0.50)

## 2019-04-25 MED ORDER — HYDROCODONE-ACETAMINOPHEN 5-325 MG PO TABS: 1.0000 | ORAL_TABLET | Freq: Four times a day (QID) | ORAL | 0 refills | Status: DC | PRN

## 2019-04-25 MED ORDER — FENTANYL CITRATE (PF) 100 MCG/2ML IJ SOLN: 25.0000 ug | Freq: Once | INTRAMUSCULAR | Status: DC

## 2019-04-25 MED ORDER — KETOROLAC TROMETHAMINE 30 MG/ML IJ SOLN
30.0000 mg | Freq: Once | INTRAMUSCULAR | Status: AC
  Administered 2019-04-25: 21:00:00 30 mg via INTRAVENOUS
  Filled 2019-04-25: qty 1

## 2019-04-25 MED ORDER — IOHEXOL 300 MG/ML  SOLN
100.0000 mL | Freq: Once | INTRAMUSCULAR | Status: AC | PRN
  Administered 2019-04-25: 20:00:00 100 mL via INTRAVENOUS

## 2019-04-25 MED ORDER — ONDANSETRON HCL 4 MG/2ML IJ SOLN
4.0000 mg | Freq: Once | INTRAMUSCULAR | Status: AC | PRN
  Administered 2019-04-25: 4 mg via INTRAVENOUS
  Filled 2019-04-25: qty 2

## 2019-04-25 MED ORDER — METOCLOPRAMIDE HCL 5 MG/ML IJ SOLN
10.0000 mg | Freq: Once | INTRAMUSCULAR | Status: AC
  Administered 2019-04-25: 21:00:00 10 mg via INTRAVENOUS
  Filled 2019-04-25: qty 2
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
## 2019-04-25 NOTE — ED Triage Notes (Addendum)
Pt had an endoscopy this AM. 30 minutes ago pt started having CP with associated ShOB and diaphoresis. Pt also noticed an area of swelling up mid chest area. Pt describes the pain as someone sitting on her chest. Pain is worse with inspiration and is tender to palpation.

## 2019-04-25 NOTE — ED Provider Notes (Signed)
ED ECG REPORT   Date: 04/25/2019  Rate: 76  Rhythm: normal sinus rhythm  QRS Axis: right  Intervals: normal  ST/T Wave abnormalities: nonspecific T wave changes  Conduction Disutrbances:none  Narrative Interpretation:   Old EKG Reviewed: none available  I have personally reviewed the EKG tracing and agree with the computerized printout as noted. Patient's right and left arm electrodes may be reversed.  Will recommend repeating it.   Fredia Sorrow, MD 04/25/19 1700

## 2019-04-25 NOTE — Discharge Instructions (Addendum)
Take Norco for severe pain every 6 hours.  Use ice to your chest 3-4 times daily alternating 20 minutes on, 20 minutes off.  Today please follow-up with your doctor if your symptoms are continuing.  Please return to the emergency department you develop any new or worsening symptoms.  Do not drink alcohol, drive, operate machinery or participate in any other potentially dangerous activities while taking opiate pain medication as it may make you sleepy. Do not take this medication with any other sedating medications, either prescription or over-the-counter. If you were prescribed Percocet or Vicodin, do not take these with acetaminophen (Tylenol) as it is already contained within these medications and overdose of Tylenol is dangerous.   This medication is an opiate (or narcotic) pain medication and can be habit forming.  Use it as little as possible to achieve adequate pain control.  Do not use or use it with extreme caution if you have a history of opiate abuse or dependence. This medication is intended for your use only - do not give any to anyone else and keep it in a secure place where nobody else, especially children, have access to it. It will also cause or worsen constipation, so you may want to consider taking an over-the-counter stool softener while you are taking this medication.

## 2019-04-25 NOTE — ED Notes (Signed)
Pt's son was with patient for procedure this AM. Stated that pt has a hard time waking up for anesthesia and they may had to perform a sternal rub. Pt exteremly tender in the sternal area on exam. Pt is drowsy during exam and falling asleep. Pt also reports a HA and nausea.

## 2019-05-02 ENCOUNTER — Other Ambulatory Visit: Payer: Self-pay | Admitting: Family Medicine

## 2019-10-29 ENCOUNTER — Other Ambulatory Visit: Payer: Self-pay | Admitting: Family Medicine

## 2019-10-29 MED ORDER — SERTRALINE HCL 50 MG PO TABS: ORAL_TABLET | ORAL | 3 refills | Status: DC

## 2020-03-13 ENCOUNTER — Other Ambulatory Visit: Payer: Self-pay

## 2020-03-13 ENCOUNTER — Emergency Department (HOSPITAL_BASED_OUTPATIENT_CLINIC_OR_DEPARTMENT_OTHER)
Admission: EM | Admit: 2020-03-13 | Discharge: 2020-03-13 | Disposition: A | Discharge status: Home / Self Care | Payer: Managed Care, Other (non HMO) | Attending: Emergency Medicine | Admitting: Emergency Medicine

## 2020-03-13 ENCOUNTER — Encounter (HOSPITAL_BASED_OUTPATIENT_CLINIC_OR_DEPARTMENT_OTHER): Payer: Self-pay | Admitting: *Deleted

## 2020-03-13 DIAGNOSIS — E119 Type 2 diabetes mellitus without complications: Secondary | ICD-10-CM | POA: Diagnosis not present

## 2020-03-13 DIAGNOSIS — Y929 Unspecified place or not applicable: Secondary | ICD-10-CM | POA: Diagnosis not present

## 2020-03-13 DIAGNOSIS — Y939 Activity, unspecified: Secondary | ICD-10-CM | POA: Diagnosis not present

## 2020-03-13 DIAGNOSIS — S0181XA Laceration without foreign body of other part of head, initial encounter: Secondary | ICD-10-CM | POA: Diagnosis present

## 2020-03-13 DIAGNOSIS — Y999 Unspecified external cause status: Secondary | ICD-10-CM | POA: Diagnosis not present

## 2020-03-13 DIAGNOSIS — S01511A Laceration without foreign body of lip, initial encounter: Secondary | ICD-10-CM | POA: Insufficient documentation

## 2020-03-13 DIAGNOSIS — W540XXA Bitten by dog, initial encounter: Secondary | ICD-10-CM | POA: Insufficient documentation

## 2020-03-13 MED ORDER — AMOXICILLIN-POT CLAVULANATE 875-125 MG PO TABS: 1.0000 | ORAL_TABLET | Freq: Two times a day (BID) | ORAL | 0 refills | Status: AC

## 2020-03-13 MED ORDER — AMOXICILLIN-POT CLAVULANATE 875-125 MG PO TABS
1.0000 | ORAL_TABLET | Freq: Once | ORAL | Status: AC
  Administered 2020-03-13: 1 via ORAL
  Filled 2020-03-13: qty 1

## 2020-03-13 MED ORDER — LIDOCAINE HCL (PF) 1 % IJ SOLN
2.0000 mL | Freq: Once | INTRAMUSCULAR | Status: AC
  Administered 2020-03-13: 23:00:00 2 mL via INTRADERMAL
  Filled 2020-03-13: qty 5

## 2020-03-13 NOTE — ED Notes (Signed)
Irrigated wound to chin with approx 30ML sterile water - pt tearful, unable to tolerate more at this time. Pinprick wounds noted to right lip and just below chin x2.

## 2020-03-13 NOTE — Discharge Instructions (Signed)
Per our discussion, I am prescribing you Augmentin.  This is an antibiotic you will take twice a day for the next 5 days to prevent infection.  Please follow-up with your primary care provider regarding this visit.  I would recommend having your wound checked in 2 days to make sure it is not infected.  I would recommend having your stitches removed in 5 days.  Your primary care provider can do this for you if you would prefer.  Please do not hesitate to return to the emergency department with any new or worsening symptoms or signs of infection.  It was a pleasure to meet you.

## 2020-03-13 NOTE — ED Triage Notes (Signed)
pt c/o dog bite to chin x 1 hr ago
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
## 2020-03-19 ENCOUNTER — Ambulatory Visit: Payer: Managed Care, Other (non HMO) | Admitting: Family Medicine

## 2020-03-19 ENCOUNTER — Other Ambulatory Visit: Payer: Self-pay

## 2020-03-19 ENCOUNTER — Encounter: Payer: Self-pay | Admitting: Family Medicine

## 2020-03-19 VITALS — BP 110/78 | HR 86 | Temp 96.9°F | Wt 148.0 lb

## 2020-03-19 DIAGNOSIS — Z4802 Encounter for removal of sutures: Secondary | ICD-10-CM
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
## 2020-03-19 NOTE — Patient Instructions (Signed)
Consider over the counter scar cream or topical vitamin E to help reduce the size of the scar.   Let us know if you need anything.

## 2020-04-08 ENCOUNTER — Other Ambulatory Visit: Payer: Self-pay | Admitting: Family Medicine

## 2020-04-08 MED ORDER — SERTRALINE HCL 50 MG PO TABS: 50.0000 mg | ORAL_TABLET | Freq: Every day | ORAL | 3 refills | Status: DC

## 2020-07-22 ENCOUNTER — Encounter: Payer: Managed Care, Other (non HMO) | Admitting: Family Medicine

## 2020-08-15 ENCOUNTER — Other Ambulatory Visit: Payer: Self-pay | Admitting: Family Medicine

## 2020-09-14 IMAGING — CT CT ABDOMEN AND PELVIS WITH CONTRAST
2 of 5 series · 16 of 46 positions shown, 18 images · IV contrast (iopamidol)
Comparison: CT abdomen pelvis, 04/21/2017

CLINICAL DATA: Right upper quadrant pain, weight loss

EXAM:
CT ABDOMEN AND PELVIS WITH CONTRAST
TECHNIQUE: Multidetector CT imaging of the abdomen and pelvis was performed
using the standard protocol following bolus administration of
intravenous contrast.
CONTRAST:  100mL 2VUQJG-TCC IOPAMIDOL (2VUQJG-TCC) INJECTION 61%

[Series 2: abd pelvis 5.00 br40 s3 ax · axial · 0.78mm/px · z∈[+1006,+1431]mm · 13 of 95 slices shown, 15 images]
[im 5/95  soft-tissue]
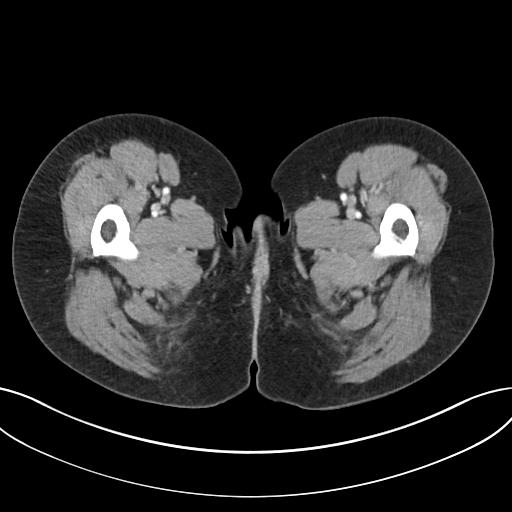
[im 5/95  bone]
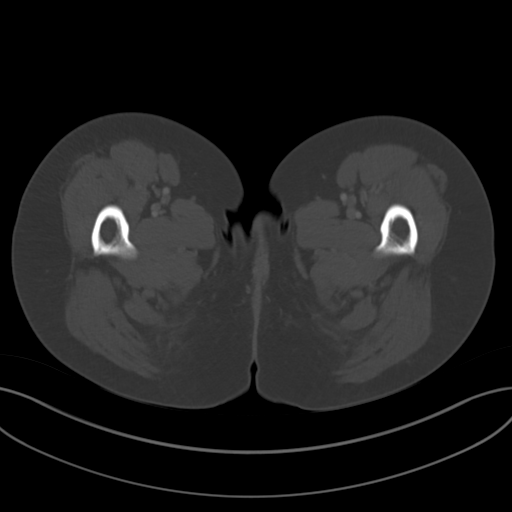
[im 15/95  soft-tissue]
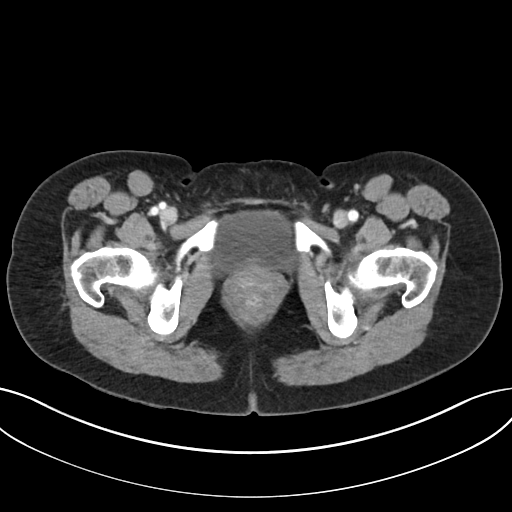
[im 20/95  soft-tissue]
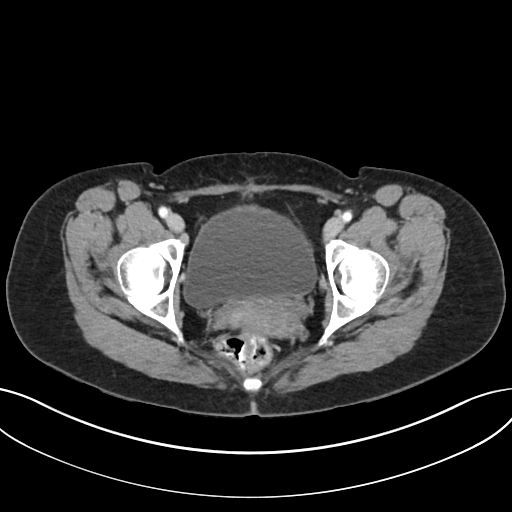
[im 25/95  soft-tissue]
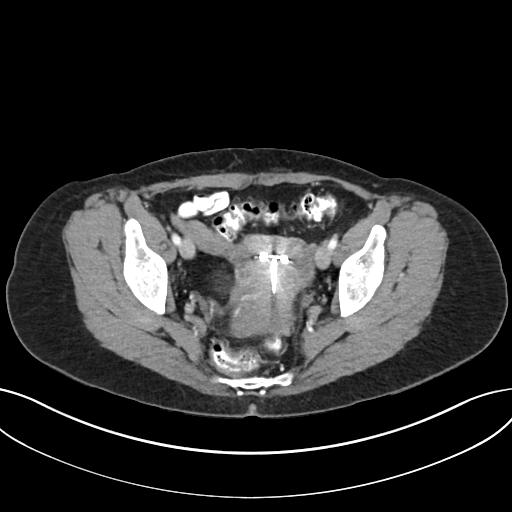
[im 35/95  soft-tissue]
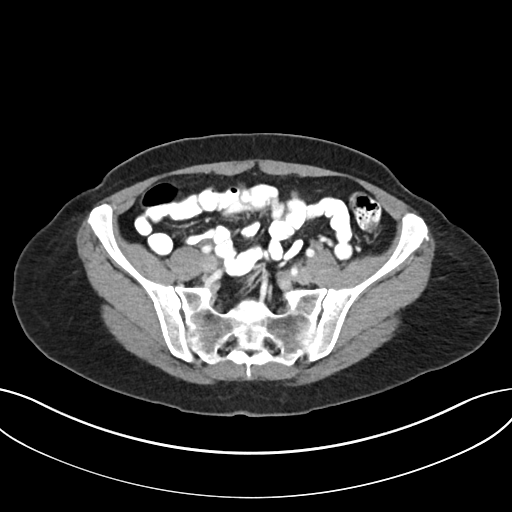
[im 40/95  soft-tissue]
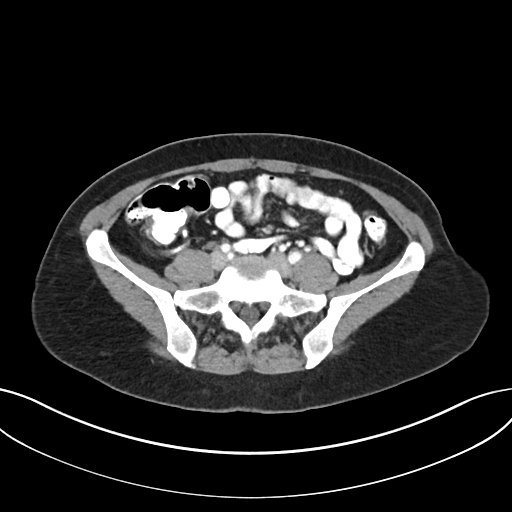
[im 50/95  soft-tissue]
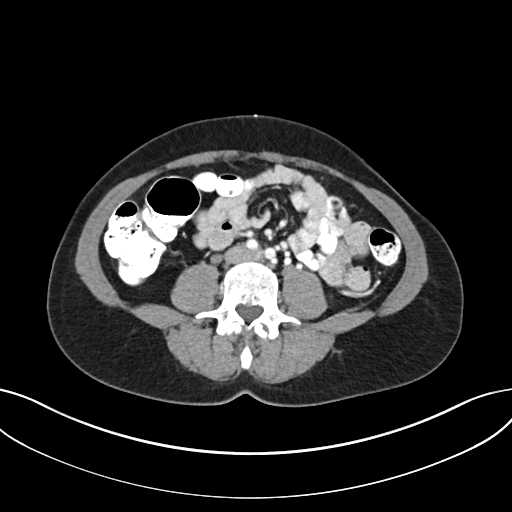
[im 55/95  soft-tissue]
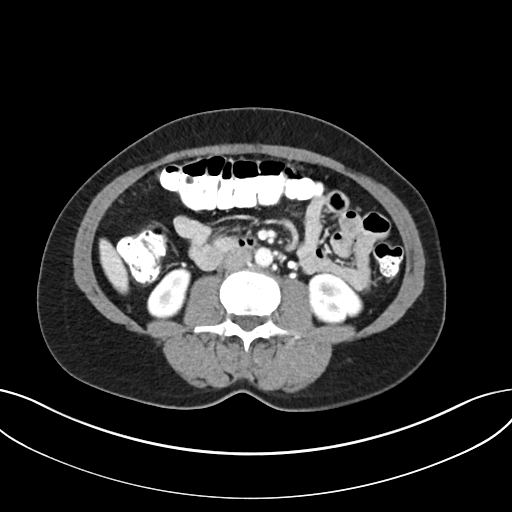
[im 60/95  soft-tissue]
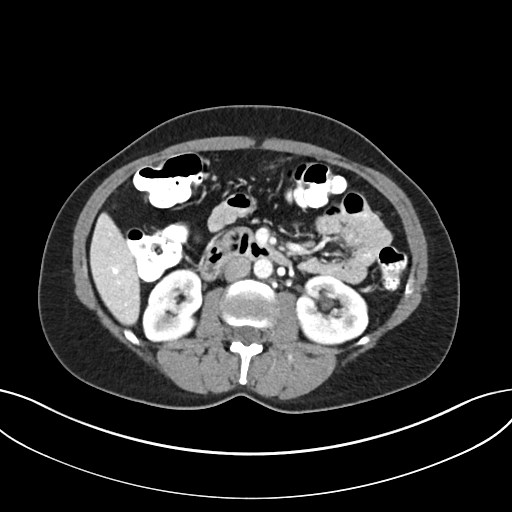
[im 60/95  bone]
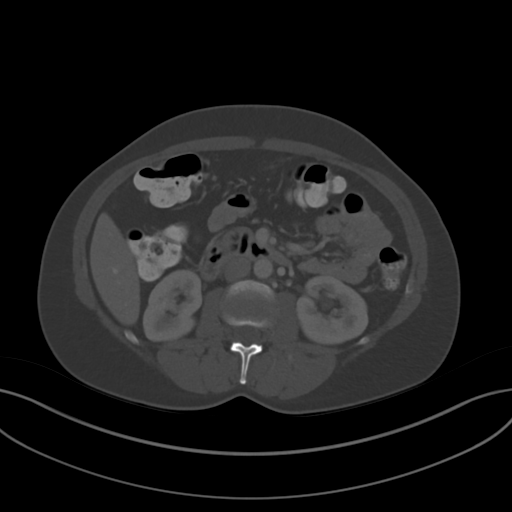
[im 70/95  soft-tissue]
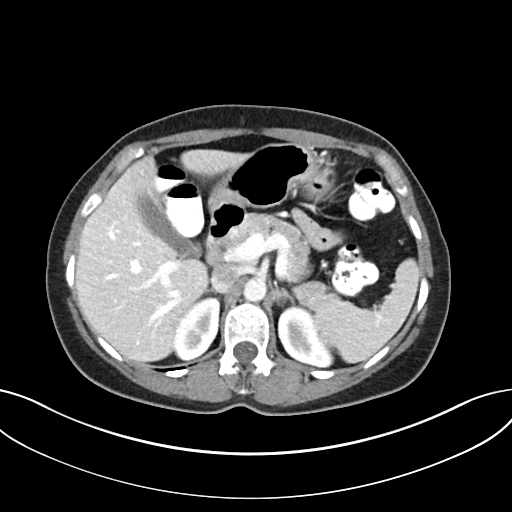
[im 75/95  soft-tissue]
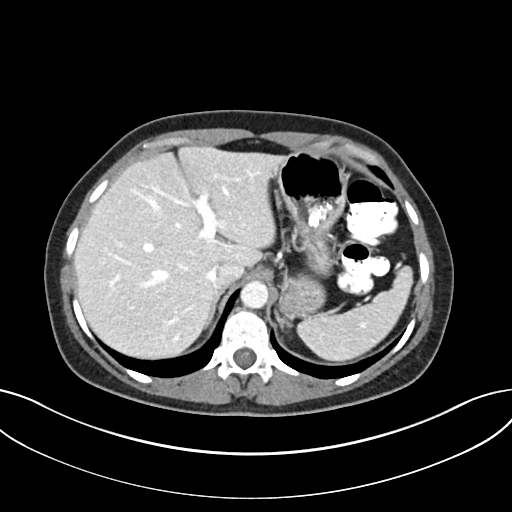
[im 80/95  soft-tissue]
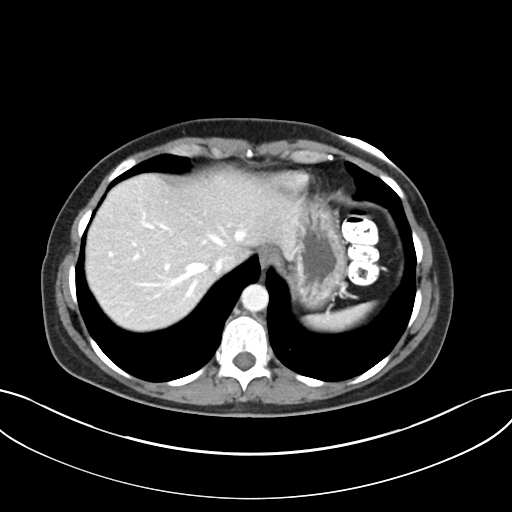
[im 90/95  soft-tissue]
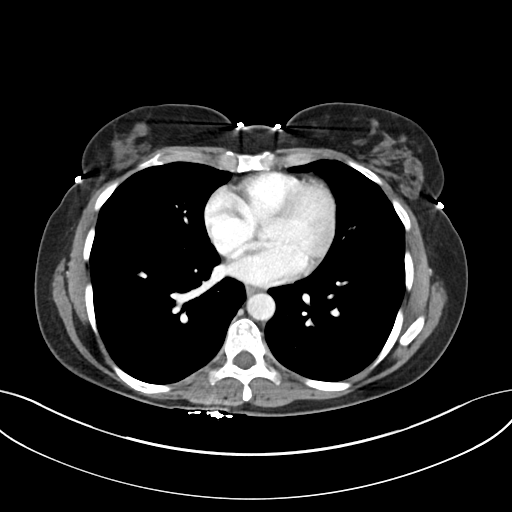

[Series 6: abd pelvis 2.00 br40 s3 cor · coronal · 0.77mm/px · 3 of 125 slices shown]
[im 42/125  soft-tissue]
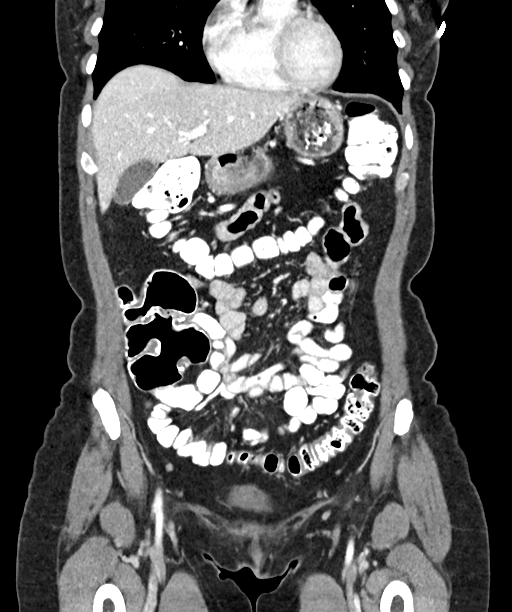
[im 56/125  soft-tissue]
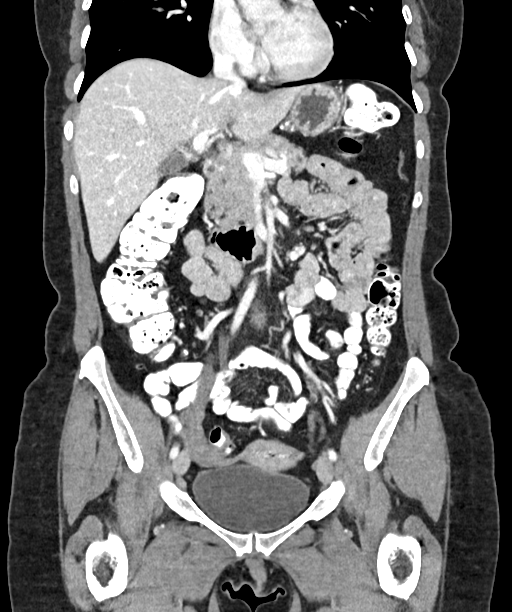
[im 69/125  soft-tissue]
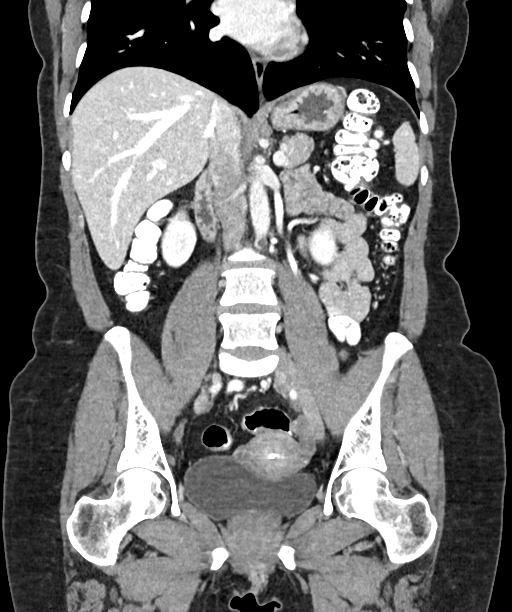

[16 of 46 positions shown; findings below may reference images not displayed]

FINDINGS: Lower chest: No acute abnormality.

Hepatobiliary: No focal liver abnormality is seen. No gallstones,
gallbladder wall thickening, or biliary dilatation.

Pancreas: Unremarkable. No pancreatic ductal dilatation or
surrounding inflammatory changes.

Spleen: Normal in size without focal abnormality.

Adrenals/Urinary Tract: Adrenal glands are unremarkable. Kidneys are
normal, without renal calculi, focal lesion, or hydronephrosis.
Bladder is unremarkable.

Stomach/Bowel: Stomach is within normal limits. Appendix appears
normal. No evidence of bowel wall thickening, distention, or
inflammatory changes.

Vascular/Lymphatic: No significant vascular findings are present. No
enlarged abdominal or pelvic lymph nodes.

Reproductive: No mass or other abnormality. IUD present in the
endometrial cavity.

Other: No abdominal wall hernia or abnormality. No abdominopelvic
ascites.

Musculoskeletal: No acute or significant osseous findings.
IMPRESSION: No CT findings of the abdomen or pelvis to explain right upper
quadrant pain or weight loss.

## 2020-10-06 IMAGING — CR CHEST - 2 VIEW
2 series · 2 of 2 positions shown · non-contrast
Comparison: 04/20/2017

CLINICAL DATA: Chest pain

EXAM:
CHEST - 2 VIEW

[w chest pa]
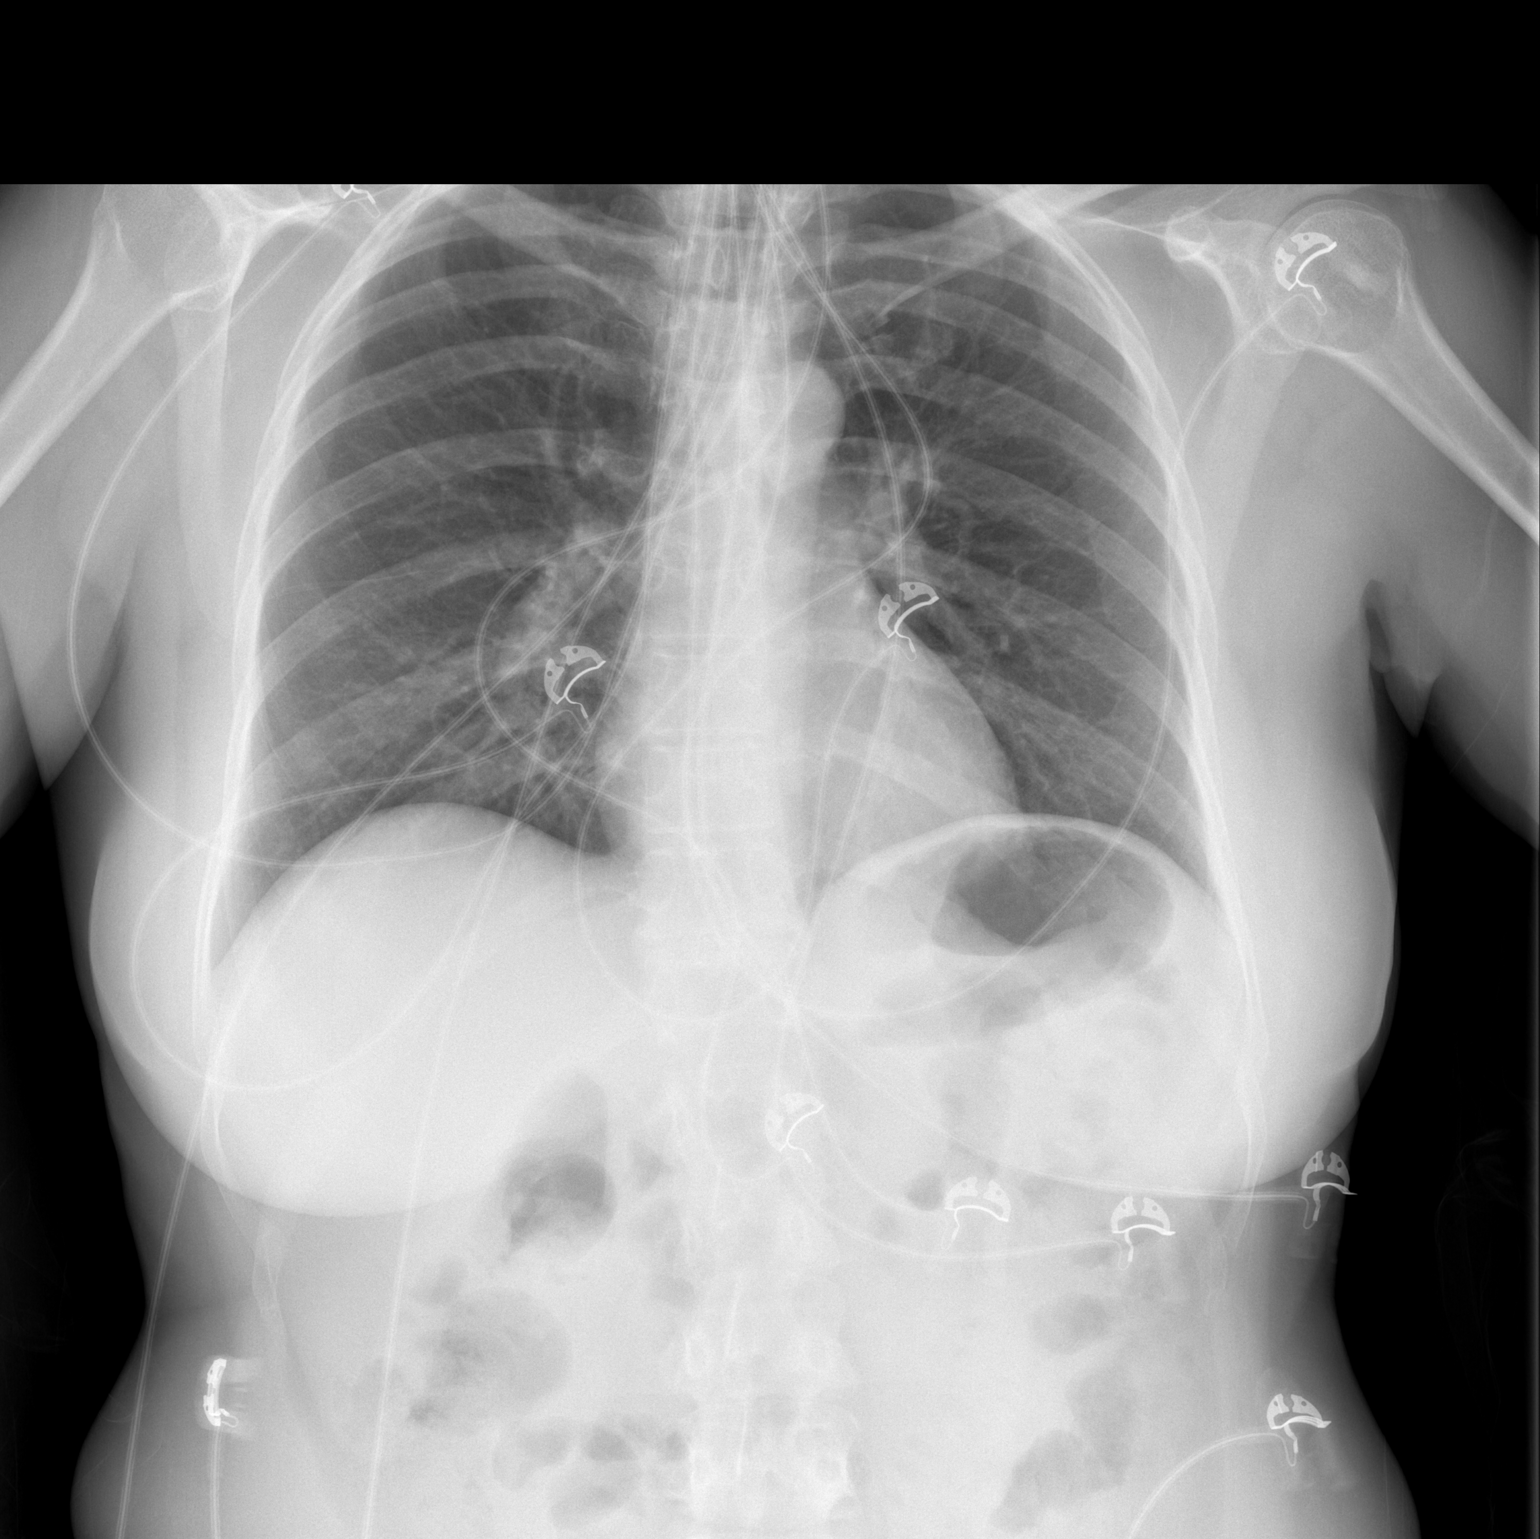

[w chest lat]
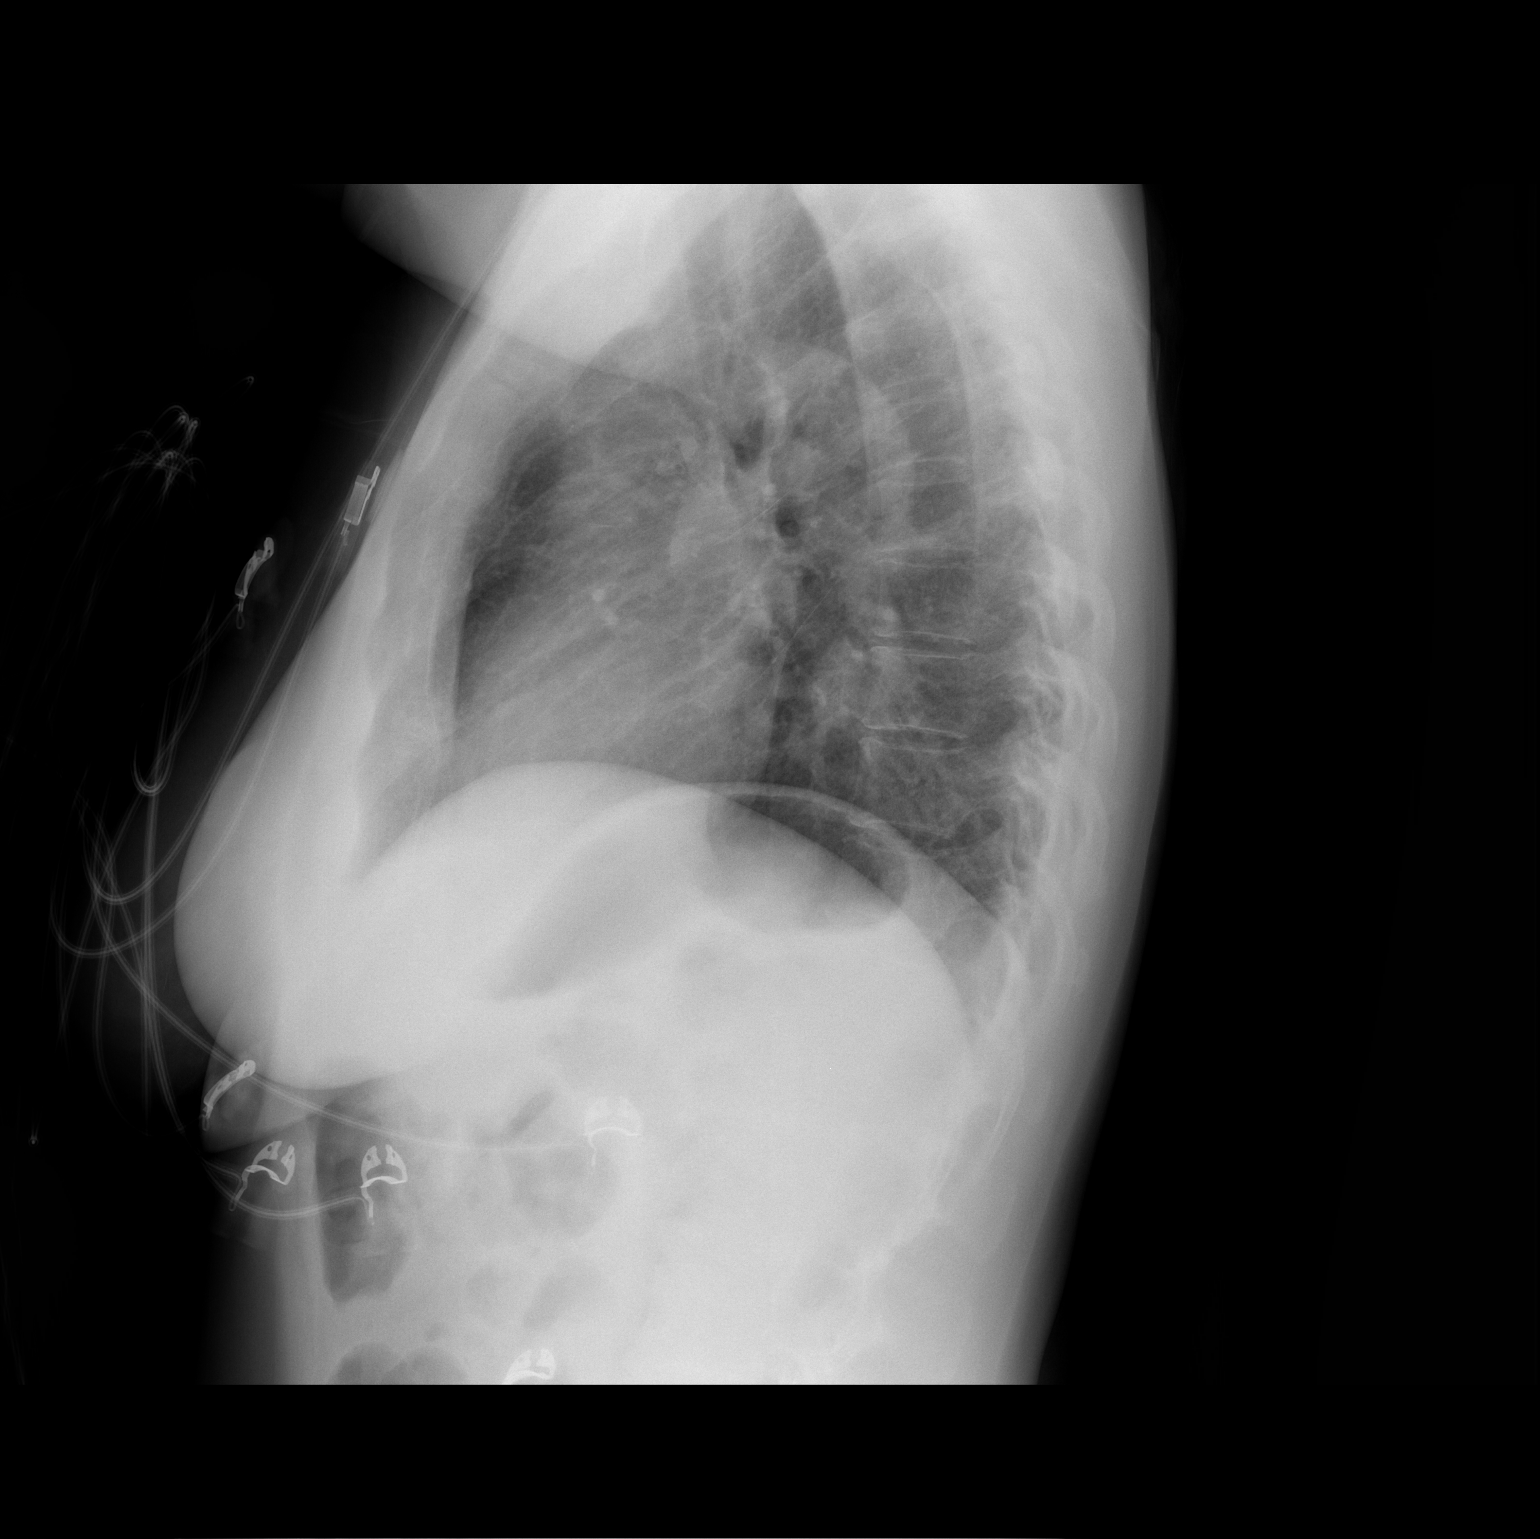

[2 of 2 positions shown; findings below may reference images not displayed]

FINDINGS: The heart size and mediastinal contours are within normal limits.
Both lungs are clear. The visualized skeletal structures are
unremarkable.
IMPRESSION: No active cardiopulmonary disease.

## 2020-11-03 ENCOUNTER — Other Ambulatory Visit: Payer: Self-pay

## 2020-11-03 ENCOUNTER — Emergency Department (HOSPITAL_BASED_OUTPATIENT_CLINIC_OR_DEPARTMENT_OTHER)
Admission: EM | Admit: 2020-11-03 | Discharge: 2020-11-03 | Disposition: A | Discharge status: Home / Self Care | Payer: Managed Care, Other (non HMO) | Attending: Emergency Medicine | Admitting: Emergency Medicine

## 2020-11-03 ENCOUNTER — Encounter (HOSPITAL_BASED_OUTPATIENT_CLINIC_OR_DEPARTMENT_OTHER): Payer: Self-pay

## 2020-11-03 DIAGNOSIS — R220 Localized swelling, mass and lump, head: Secondary | ICD-10-CM | POA: Diagnosis present

## 2020-11-03 DIAGNOSIS — E119 Type 2 diabetes mellitus without complications: Secondary | ICD-10-CM | POA: Diagnosis not present

## 2020-11-03 DIAGNOSIS — H02843 Edema of right eye, unspecified eyelid: Secondary | ICD-10-CM | POA: Diagnosis not present

## 2020-11-03 MED ORDER — DIPHENHYDRAMINE HCL 25 MG PO CAPS
25.0000 mg | ORAL_CAPSULE | Freq: Once | ORAL | Status: AC
  Administered 2020-11-03: 25 mg via ORAL
  Filled 2020-11-03: qty 1

## 2020-11-03 MED ORDER — PREDNISONE 50 MG PO TABS
60.0000 mg | ORAL_TABLET | Freq: Once | ORAL | Status: AC
  Administered 2020-11-03: 60 mg via ORAL
  Filled 2020-11-03: qty 1

## 2020-11-03 NOTE — ED Notes (Signed)
Pt reports awakening with swelling left eye, itch, increased tearing, denies vision changes, does wear contact lenses.  Denies discharge.

## 2020-11-03 NOTE — Discharge Instructions (Addendum)
Return if any problems.  Benadryl 25 mg every 4 hours.

## 2020-11-03 NOTE — ED Triage Notes (Signed)
Pt reports irritation and swelling below her eyes since yesterday. Pt also reports eye watering.
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
## 2021-01-23 ENCOUNTER — Other Ambulatory Visit: Payer: Self-pay

## 2021-01-23 ENCOUNTER — Encounter: Payer: Self-pay | Admitting: Family Medicine

## 2021-01-23 ENCOUNTER — Ambulatory Visit: Payer: Managed Care, Other (non HMO) | Admitting: Family Medicine

## 2021-01-23 VITALS — BP 118/60 | HR 76 | Temp 97.8°F | Resp 16 | Ht 63.0 in | Wt 144.0 lb

## 2021-01-23 DIAGNOSIS — R140 Abdominal distension (gaseous): Secondary | ICD-10-CM | POA: Diagnosis not present

## 2021-01-23 DIAGNOSIS — I83811 Varicose veins of right lower extremities with pain: Secondary | ICD-10-CM

## 2021-01-23 DIAGNOSIS — K920 Hematemesis: Secondary | ICD-10-CM | POA: Diagnosis not present

## 2021-01-23 MED ORDER — ESOMEPRAZOLE MAGNESIUM 40 MG PO CPDR: 40.0000 mg | DELAYED_RELEASE_CAPSULE | Freq: Every day | ORAL | 3 refills | Status: AC

## 2021-01-23 MED ORDER — ONDANSETRON 4 MG PO TBDP: 4.0000 mg | ORAL_TABLET | Freq: Three times a day (TID) | ORAL | 0 refills | Status: AC | PRN
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
## 2021-01-23 NOTE — Patient Instructions (Addendum)
Try to drink 55-60 oz of water daily outside of exercise.  If you do not hear anything about your referral in the next 1-2 weeks, call our office and ask for an update.  Keep the diet clean and stay active.  Try to keep a log of what foods/beverages flare your symptoms.   The area on your buttock feels like a vessel. Let's try some compression stockings to see how things go. There is nothing to physically remove at this point.  Elevate your legs. Stay active.  Give [place] 2-3 business days to get the results of your labs back.   Let us know if you need anything.

## 2021-01-26 LAB — H. PYLORI BREATH TEST: H. pylori Breath Test: NOT DETECTED

## 2021-02-02 ENCOUNTER — Ambulatory Visit: Payer: Managed Care, Other (non HMO) | Admitting: Family Medicine

## 2021-02-13 ENCOUNTER — Other Ambulatory Visit: Payer: Self-pay

## 2021-02-13 ENCOUNTER — Encounter: Payer: Self-pay | Admitting: Family Medicine

## 2021-02-13 ENCOUNTER — Ambulatory Visit: Payer: Managed Care, Other (non HMO) | Admitting: Family Medicine

## 2021-02-13 VITALS — BP 108/68 | HR 78 | Temp 98.3°F | Ht 62.0 in | Wt 143.0 lb

## 2021-02-13 DIAGNOSIS — D2361 Other benign neoplasm of skin of right upper limb, including shoulder: Secondary | ICD-10-CM | POA: Diagnosis not present

## 2021-02-13 DIAGNOSIS — L821 Other seborrheic keratosis: Secondary | ICD-10-CM | POA: Diagnosis not present

## 2021-02-13 DIAGNOSIS — L989 Disorder of the skin and subcutaneous tissue, unspecified: Secondary | ICD-10-CM

## 2021-02-13 DIAGNOSIS — D489 Neoplasm of uncertain behavior, unspecified: Secondary | ICD-10-CM

## 2021-02-13 NOTE — Patient Instructions (Addendum)
Do not shower for the rest of the day. When you do wash it, use only soap and water. Do not vigorously scrub. Apply triple antibiotic ointment (like Neosporin) twice daily. Keep the area clean and dry.   Things to look out for: increasing pain not relieved by ibuprofen/acetaminophen, fevers, spreading redness, drainage of pus, or foul odor.  Give [place] 1 week to get the results of your biopsies back.  If you do not hear anything about your referral in the next 1-2 weeks, call our office and ask for an update.  Let us know if you need anything.
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
## 2021-03-26 ENCOUNTER — Encounter: Payer: Self-pay | Admitting: Surgery

## 2021-03-26 ENCOUNTER — Ambulatory Visit: Payer: Self-pay | Admitting: Surgery

## 2021-03-26 NOTE — H&P (Signed)
  Pleasant woman who has noticed a lump on her inner buttocks.  Been there for a while.  More recently its become more painful and uncomfortable.  Noticeable when she sits or turns or twists.  Hard to sleep or sit at night.  More annoying.  Occasional swelling but it is never drained.  Never had to have it lanced.  She does smoke cigarettes but maybe does 1 or 2 a weekend.  She is also noticed a lump in her inner left arm.  She has a lesion on her right forearm its been a hard fibrotic nodule.  It was removed but came back.  She does not really recall being told it was cancer.  She walks 30 minutes that difficulty.  She tends to have constipation but without the laxative she can go at least every other day.  She has some mild reflux at night but no other digestive tract issues.  No diabetes.  She is not on blood thinners.  No sleep apnea.  No history of fall or trauma.  No history of neurofibromatosis.  No history of skin abscesses or MRSA that she is aware of.   Adin Hector, MD, FACS, MASCRS  Esophageal, Gastrointestinal & Colorectal Surgery Robotic and Minimally Invasive Surgery [facility] [address] [place], [place] [address] [phone] Fax [phone] Main/Paging  CONTACT INFORMATION: Weekday (9AM-5PM) concerns: Call [facility] main office at [phone] Weeknight (5PM-9AM) or Weekend/Holiday concerns: Check [email or web address] for General Surgery [facility] coverage (Please, do not use SecureChat as it is not reliable communication to operating surgeons for immediate patient care)

## 2021-06-22 ENCOUNTER — Telehealth: Payer: Self-pay | Admitting: Family Medicine

## 2021-06-22 NOTE — Telephone Encounter (Signed)
Pt states she would like TOC from [place] to [place]... Patient feels like Nani Ravens never knows what she talking about.

## 2021-06-22 NOTE — Telephone Encounter (Signed)
OK w me.  

## 2021-06-26 NOTE — Telephone Encounter (Signed)
Pt contacted and understood . She stated she would research and find a new provider. Dr. Nani Ravens name was removed as PCP.

## 2021-06-29 ENCOUNTER — Telehealth: Payer: Self-pay

## 2021-06-29 NOTE — Telephone Encounter (Signed)
FYI- Pt called in stated that she needs labs for procedure that she is having done in [place]. I told that you removed her from being her pcp. Pt wanted to know if you would just put labs and I told her no once your removed you are unable to put in labs for her.

## 2024-01-16 ENCOUNTER — Other Ambulatory Visit: Payer: Self-pay

## 2024-01-16 ENCOUNTER — Emergency Department (HOSPITAL_BASED_OUTPATIENT_CLINIC_OR_DEPARTMENT_OTHER): Payer: Managed Care, Other (non HMO)

## 2024-01-16 ENCOUNTER — Emergency Department (HOSPITAL_BASED_OUTPATIENT_CLINIC_OR_DEPARTMENT_OTHER)
Admission: EM | Admit: 2024-01-16 | Discharge: 2024-01-16 | Disposition: A | Discharge status: Home / Self Care | Payer: Managed Care, Other (non HMO) | Attending: Emergency Medicine | Admitting: Emergency Medicine

## 2024-01-16 ENCOUNTER — Encounter (HOSPITAL_BASED_OUTPATIENT_CLINIC_OR_DEPARTMENT_OTHER): Payer: Self-pay | Admitting: Emergency Medicine

## 2024-01-16 DIAGNOSIS — Z23 Encounter for immunization: Secondary | ICD-10-CM | POA: Diagnosis not present

## 2024-01-16 DIAGNOSIS — W540XXA Bitten by dog, initial encounter: Secondary | ICD-10-CM | POA: Insufficient documentation

## 2024-01-16 DIAGNOSIS — S61452A Open bite of left hand, initial encounter: Secondary | ICD-10-CM | POA: Insufficient documentation

## 2024-01-16 MED ORDER — TETANUS-DIPHTH-ACELL PERTUSSIS 5-2.5-18.5 LF-MCG/0.5 IM SUSY
0.5000 mL | PREFILLED_SYRINGE | Freq: Once | INTRAMUSCULAR | Status: AC
  Administered 2024-01-16: 0.5 mL via INTRAMUSCULAR
  Filled 2024-01-16: qty 0.5

## 2024-01-16 MED ORDER — AMOXICILLIN-POT CLAVULANATE 875-125 MG PO TABS: 1.0000 | ORAL_TABLET | Freq: Two times a day (BID) | ORAL | 0 refills | Status: AC

## 2024-01-16 MED ORDER — BACITRACIN ZINC 500 UNIT/GM EX OINT
TOPICAL_OINTMENT | Freq: Two times a day (BID) | CUTANEOUS | Status: DC
  Administered 2024-01-16: 31.5 via TOPICAL
  Filled 2024-01-16: qty 28.35

## 2024-01-16 MED ORDER — AMOXICILLIN-POT CLAVULANATE 875-125 MG PO TABS
1.0000 | ORAL_TABLET | Freq: Once | ORAL | Status: AC
  Administered 2024-01-16: 1 via ORAL
  Filled 2024-01-16: qty 1

## 2024-01-16 MED ORDER — LIDOCAINE HCL (PF) 1 % IJ SOLN
5.0000 mL | Freq: Once | INTRAMUSCULAR | Status: AC
  Administered 2024-01-16: 5 mL
  Filled 2024-01-16: qty 5

## 2024-01-16 NOTE — ED Notes (Signed)
 Imaging at bedside.

## 2024-01-16 NOTE — Discharge Instructions (Addendum)
 You were evaluated in the emergency room for eye pain and drainage.  Your x-ray did not show any fracture or retained foreign body.  Your wound was sutured closed.  Prescription for Augmentin was sent into your pharmacy.  Please be sure to complete the full course of antibiotics.  Please return here or present to urgent care or primary care provider for suture removal.  You can wash your wounds daily with soapy water.  If you experience any new or worsening symptoms including extreme worsening pain, redness and swelling or drainage of pus please return to the emergency room.

## 2024-01-16 NOTE — ED Provider Notes (Signed)
 [place] [facility] Provider Note   CSN: [id number] Arrival date & time: 01/16/24  1909     History  Chief Complaint  Patient presents with   Animal Bite    Kristy Wilkins is a 58 y.o. female who presents with dog bite to her left hand.  She was bitten by her own dog.  Her dog is up-to-date on rabies.  Patient's is unsure of her tetanus status.  Denies any numbness or tingling to her extremity.   Animal Bite      Home Medications Prior to Admission medications   Medication Sig Start Date End Date Taking? Authorizing Provider  amoxicillin-clavulanate (AUGMENTIN) 875-125 MG tablet Take 1 tablet by mouth every 12 (twelve) hours. 01/16/24  Yes Halford Decamp, PA-C  esomeprazole (NEXIUM) 40 MG capsule Take 1 capsule (40 mg total) by mouth daily. 01/23/21   Sharlene Dory, DO  ondansetron (ZOFRAN-ODT) 4 MG disintegrating tablet Take 1 tablet (4 mg total) by mouth every 8 (eight) hours as needed for nausea or vomiting. 01/23/21   Sharlene Dory, DO  OVER THE COUNTER MEDICATION **Skin Collagen**  Takes 1 tablet daily    [provider]  PARAGARD INTRAUTERINE COPPER IUD IUD by Intrauterine route.    [provider]  sertraline (ZOLOFT) 50 MG tablet TAKE ONE TABLET BY MOUTH DAILY 08/15/20   Sharlene Dory, DO      Allergies    Patient has no known allergies.    Review of Systems   Review of Systems  Musculoskeletal:  Positive for myalgias.    Physical Exam Updated Vital Signs BP (!) 151/90   Pulse 95   Temp 98.1 F (36.7 C)   Resp (!) 23   Ht 5\' 2"  (1.575 m)   Wt 62.6 kg   SpO2 95%   BMI 25.24 kg/m  Physical Exam Vitals and nursing note reviewed.  Constitutional:      General: She is not in acute distress.    Appearance: She is well-developed.  HENT:     Head: Normocephalic and atraumatic.  Eyes:     Conjunctiva/sclera: Conjunctivae normal.  Cardiovascular:     Rate and Rhythm: Normal  rate.     Pulses: Normal pulses.  Pulmonary:     Effort: Pulmonary effort is normal. No respiratory distress.  Musculoskeletal:     Cervical back: Neck supple.     Comments: Examination of left upper extremity demonstrates 2 cm dorsal laceration between the long finger and ring finger MCPs and 3 cm dorsal laceration over significant medical carpal, there is overlying swelling with corresponding tenderness.  Difficulty making full fist.  Motor function intact.  NVI, cap refill less than 2 secs.  Hemostasis is yet to be achieved.  No overlying erythema, warmth, induration, crepitus, fluctuance.  Skin:    General: Skin is warm and dry.     Capillary Refill: Capillary refill takes less than 2 seconds.  Neurological:     Mental Status: She is alert.  Psychiatric:        Mood and Affect: Mood normal.    ED Results / Procedures / Treatments   Labs (all labs ordered are listed, but only abnormal results are displayed) Labs Reviewed - No data to display  EKG None  Radiology No results found.  Procedures .Laceration Repair  Date/Time: 01/16/2024 11:09 PM  Performed by: Halford Decamp, PA-C Authorized by: Halford Decamp, PA-C   Consent:    Consent obtained:  Verbal   Consent given by:  Patient   Risks, benefits, and alternatives were discussed: yes     Risks discussed:  Infection and pain   Alternatives discussed:  No treatment and observation Universal protocol:    Procedure explained and questions answered to patient or proxy's satisfaction: yes     Patient identity confirmed:  Verbally with patient and arm band Anesthesia:    Anesthesia method:  Local infiltration   Local anesthetic:  Lidocaine 1% w/o epi Laceration details:    Location:  Hand   Hand location:  L hand, dorsum   Length (cm):  2 Treatment:    Area cleansed with:  Povidone-iodine Skin repair:    Repair method:  Sutures   Suture size:  5-0   Suture material:  Prolene   Suture technique:  Simple  interrupted Approximation:    Approximation:  Loose Repair type:    Repair type:  Simple Post-procedure details:    Dressing:  Antibiotic ointment and sterile dressing   Procedure completion:  Tolerated     Medications Ordered in ED Medications  bacitracin ointment (31.5 Applications Topical Given 01/16/24 2301)  Tdap (BOOSTRIX) injection 0.5 mL (0.5 mLs Intramuscular Given 01/16/24 2224)  lidocaine (PF) (XYLOCAINE) 1 % injection 5 mL (5 mLs Other Given by Other 01/16/24 2225)  amoxicillin-clavulanate (AUGMENTIN) 875-125 MG per tablet 1 tablet (1 tablet Oral Given 01/16/24 2256)    ED Course/ Medical Decision Making/ A&P                                 Medical Decision Making  This patient presents to the ED with chief complaint(s) of dog bite .  The complaint involves an extensive differential diagnosis and also carries with it a high risk of complications and morbidity.   pertinent past medical history as listed in HPI  The differential diagnosis includes  Fracture, sprain, dislocation, vascular/nerve/tendon injury, retained foreign body, cellulitis The initial plan is to  Will apply pressure gesturing obtain plain films Additional history obtained: Records reviewed Care Everywhere/External Records  Initial Assessment:   Hemodynamically stable patient presents with complaints of dog bite to left hand.  She has 2 lacerations that when making a full fist widen.  There is mild swelling and tenderness to her medical carpals.  She is capable of making a full fist NVI.  Overall no suspicion for vascular, nerve or tendon injury.  There is no overlying erythema, warmth or fluctuance.  No suspicion for infectious process particularly in the acute setting. Discussed primary versus secondary closure. There is persistent bleeding despite pressure dressings and wounds are fairly deep.  Using shared decision making, decided to close wounds loosely with primary closure. Will treat empirically with  Augmentin.  Independent ECG interpretation:  none  Independent labs interpretation:  The following labs were independently interpreted:  none  Independent visualization and interpretation of imaging: I independently visualized the following imaging with scope of interpretation limited to determining acute life threatening conditions related to emergency care: hand xray, which revealed no acute abnormality or retained foreign body  Treatment and Reassessment: Patient given tetanus booster.  Wounds sutured closed loosely  Consultations obtained:   none  Disposition:   Patient will be discharged home with prescription for Augmentin.  Will return in 7 to 10 days for suture removal. The patient has been appropriately medically screened and/or stabilized in the ED. I have low suspicion for any other  emergent medical condition which would require further screening, evaluation or treatment in the ED or require inpatient management. At time of discharge the patient is hemodynamically stable and in no acute distress. I have discussed work-up results and diagnosis with patient and answered all questions. Patient is agreeable with discharge plan. We discussed strict return precautions for returning to the emergency department and they verbalized understanding.     Social Determinants of Health:   none  This note was dictated with voice recognition software.  Despite best efforts at proofreading, errors may have occurred which can change the documentation meaning.          Final Clinical Impression(s) / ED Diagnoses Final diagnoses:  Dog bite, initial encounter    Rx / DC Orders ED Discharge Orders          Ordered    amoxicillin-clavulanate (AUGMENTIN) 875-125 MG tablet  Every 12 hours        01/16/24 2248              Halford Decamp, PA-C 01/16/24 2321    Virgina Norfolk, DO 01/16/24 2339

## 2024-01-16 NOTE — ED Triage Notes (Signed)
 Pt c/o left hand pain after she was bitten by her dog at home this evening. Dog belongs to the patient and is up to date on rabies vaccination. Pt has two lacerations to posterior hand but bleeding is controlled. Last tetanus shot was possibly 7 years ago. Pt is unsure.

## 2024-01-16 NOTE — ED Notes (Signed)
 D/c paperwork reviewed with pt, including follow up care.  All questions and/or concerns addressed at time of d/c.  No further needs expressed. . Pt verbalized understanding, Ambulatory with family to ED exit, NAD.

## 2024-07-06 ENCOUNTER — Encounter (HOSPITAL_BASED_OUTPATIENT_CLINIC_OR_DEPARTMENT_OTHER): Payer: Self-pay

## 2024-07-06 ENCOUNTER — Other Ambulatory Visit: Payer: Self-pay

## 2024-07-06 ENCOUNTER — Emergency Department (HOSPITAL_BASED_OUTPATIENT_CLINIC_OR_DEPARTMENT_OTHER)
Admission: EM | Admit: 2024-07-06 | Discharge: 2024-07-06 | Disposition: A | Discharge status: Home / Self Care | Attending: Emergency Medicine | Admitting: Emergency Medicine

## 2024-07-06 DIAGNOSIS — Y93G1 Activity, food preparation and clean up: Secondary | ICD-10-CM | POA: Diagnosis not present

## 2024-07-06 DIAGNOSIS — S61210A Laceration without foreign body of right index finger without damage to nail, initial encounter: Secondary | ICD-10-CM | POA: Diagnosis not present

## 2024-07-06 DIAGNOSIS — W260XXA Contact with knife, initial encounter: Secondary | ICD-10-CM | POA: Insufficient documentation

## 2024-07-06 DIAGNOSIS — S6991XA Unspecified injury of right wrist, hand and finger(s), initial encounter: Secondary | ICD-10-CM | POA: Diagnosis present

## 2024-07-06 NOTE — ED Provider Notes (Signed)
 [place] [facility] Provider Note   CSN: [id number] Arrival date & time: 07/06/24  9073     Patient presents with: Laceration   Kristy Wilkins is a 58 y.o. female.   Patient is a 58 year old female who presents with a laceration to her right index finger.  She was washing some dishes and her sponge slipped and she cut the tip of her finger on the knife that she was watching.  Her tetanus shot was updated a few months ago when she was here for dog bite.  She has some burning at the tip of the finger where the laceration is but no other injuries.       Prior to Admission medications   Medication Sig Start Date End Date Taking? Authorizing Provider  amoxicillin -clavulanate (AUGMENTIN ) 875-125 MG tablet Take 1 tablet by mouth every 12 (twelve) hours. 01/16/24   Donnajean Lynwood DEL, PA-C  esomeprazole  (NEXIUM ) 40 MG capsule Take 1 capsule (40 mg total) by mouth daily. 01/23/21   Frann Mabel Mt, DO  ondansetron  (ZOFRAN -ODT) 4 MG disintegrating tablet Take 1 tablet (4 mg total) by mouth every 8 (eight) hours as needed for nausea or vomiting. 01/23/21   Frann Mabel Mt, DO  OVER THE COUNTER MEDICATION **Skin Collagen**  Takes 1 tablet daily    [provider]  PARAGARD INTRAUTERINE COPPER IUD IUD by Intrauterine route.    [provider]  sertraline  (ZOLOFT ) 50 MG tablet TAKE ONE TABLET BY MOUTH DAILY 08/15/20   Frann Mabel Mt, DO    Allergies: Patient has no known allergies.    Review of Systems  Constitutional:  Negative for fever.  Gastrointestinal:  Negative for nausea and vomiting.  Musculoskeletal:  Negative for arthralgias, back pain, joint swelling and neck pain.  Skin:  Positive for wound.       1 cm linear laceration over the distal phalanx, volar side of the right index finger.  The edges are well-approximated.  There is minimal bleeding.  She is able to flex and extend the finger including isolated flexion  of the distal phalanx.  There is no underlying bony tenderness.  She has normal sensation distally.  Capillary refills less than 2 distally.  Laceration does not cross the joint  Neurological:  Negative for weakness, numbness and headaches.    Updated Vital Signs BP 104/81 (BP Location: Left Arm)   Pulse 66   Temp 98 F (36.7 C) (Oral)   Resp 16   Wt 57.2 kg   LMP  (LMP Unknown)   SpO2 100%   BMI 23.05 kg/m   Physical Exam  (all labs ordered are listed, but only abnormal results are displayed) Labs Reviewed - No data to display  EKG: None  Radiology: No results found.   .Laceration Repair  Date/Time: 07/06/2024 11:04 AM  Performed by: Lenor Hollering, MD Authorized by: Lenor Hollering, MD   Consent:    Consent obtained:  Verbal   Consent given by:  Patient   Risks, benefits, and alternatives were discussed: yes   Laceration details:    Length (cm):  1 Pre-procedure details:    Preparation:  Patient was prepped and draped in usual sterile fashion Exploration:    Hemostasis achieved with:  Direct pressure   Imaging outcome: foreign body not noted     Wound exploration: entire depth of wound visualized     Wound extent: no foreign body, no signs of injury, no tendon damage and no vascular damage  Contaminated: no   Treatment:    Area cleansed with:  Soap and water (irrigated at sink with running water by pt)   Amount of cleaning:  Standard   Irrigation solution:  Tap water   Irrigation method:  Tap   Visualized foreign bodies/material removed: no   Skin repair:    Repair method:  Tissue adhesive Approximation:    Approximation:  Close Repair type:    Repair type:  Simple Post-procedure details:    Dressing:  Open (no dressing)    Medications Ordered in the ED - No data to display                                  Medical Decision Making  Patient presents with a small laceration to the tip of her right index finger.  The edges are well-approximated.   No clinical concerns for tendon rupture.  No clinical concerns for fracture or foreign body.  The wound was repaired with Dermabond.  Patient tolerated the procedure well.  Was given wound care instructions and return precautions.     Final diagnoses:  Laceration of right index finger without foreign body without damage to nail, initial encounter    ED Discharge Orders     None          Lenor Hollering, MD 07/06/24 1106

## 2024-07-06 NOTE — ED Triage Notes (Signed)
 Cut R index finger w knife this morning. Wrapped w bandaiad in triage, no active bleeding. Pt concerned it might need stitches.
